# Patient Record
Sex: Female | Born: 1985 | Race: White | Hispanic: No | Smoking: Never smoker
Health system: Southern US, Community
[De-identification: ages and names within clinical notes are randomized; demographics above are authoritative.]

## PROBLEM LIST (undated history)

## (undated) DIAGNOSIS — R4789 Other speech disturbances: Secondary | ICD-10-CM

## (undated) DIAGNOSIS — F32A Depression, unspecified: Secondary | ICD-10-CM

## (undated) DIAGNOSIS — R413 Other amnesia: Secondary | ICD-10-CM

## (undated) DIAGNOSIS — F329 Major depressive disorder, single episode, unspecified: Secondary | ICD-10-CM

## (undated) HISTORY — DX: Other amnesia: R41.3

## (undated) HISTORY — DX: Other speech disturbances: R47.89

---
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
## 2005-05-28 ENCOUNTER — Encounter: Admission: RE | Admit: 2005-05-28 | Discharge: 2005-05-28 | Payer: Self-pay | Admitting: Family Medicine

## 2008-05-28 ENCOUNTER — Inpatient Hospital Stay (HOSPITAL_COMMUNITY): Admission: AD | Admit: 2008-05-28 | Discharge: 2008-05-30 | Payer: Self-pay | Admitting: Obstetrics and Gynecology

## 2008-06-14 ENCOUNTER — Encounter: Admission: RE | Admit: 2008-06-14 | Discharge: 2008-06-20 | Payer: Self-pay | Admitting: Obstetrics and Gynecology

## 2010-04-16 LAB — CBC
HCT: 30.2 % — ABNORMAL LOW (ref 36.0–46.0)
HCT: 39 % (ref 36.0–46.0)
Hemoglobin: 10.6 g/dL — ABNORMAL LOW (ref 12.0–15.0)
Hemoglobin: 13.7 g/dL (ref 12.0–15.0)
MCHC: 35.2 g/dL (ref 30.0–36.0)
MCV: 96.7 fL (ref 78.0–100.0)
Platelets: 175 10*3/uL (ref 150–400)
Platelets: 227 10*3/uL (ref 150–400)
RBC: 4.06 MIL/uL (ref 3.87–5.11)
RDW: 13.5 % (ref 11.5–15.5)
WBC: 10.9 10*3/uL — ABNORMAL HIGH (ref 4.0–10.5)

## 2010-04-16 LAB — RH IMMUNE GLOB WKUP(>/=20WKS)(NOT WOMEN'S HOSP): Fetal Screen: NEGATIVE

## 2014-01-06 NOTE — L&D Delivery Note (Signed)
Delivery Note  First Stage: Labor onset: 0300 Augmentation: AROM Analgesia /Anesthesia intrapartum: none AROM at 1124  Second Stage: Complete dilation at 1140 Onset of pushing at 1140 FHR second stage 110s, mod variability, no accels, variables w/pushing  In maternal lithotomy, delivery of a viable female at [id number] by CNM in OA position with restitution to LOT and Rt compound arm, cord around arm x1, loose Cord double clamped after cessation of pulsation, cut by CNM Cord blood sample collected   Collection of cord blood donation n/a Arterial cord blood sample n/a  Third Stage: Placenta delivered via Tomasa BlaseSchultz intact with 3 VC @ 1207 Placenta disposition: routine disposal Uterine tone firm / bleeding small  2nd degree perineal and Rt labial laceration identified  Anesthesia for repair: local Repaired with 2-0 & 3-0 Vicryl rapide Est. Blood Loss (mL): 300  Complications: none  Mom to postpartum.  Baby to Couplet care / Skin to Skin.  Newborn: Birth Weight: pending Apgar Scores: 9-9 Feeding planned: breast  Donette LarryBHAMBRI, Tahjanae Blankenburg, N MSN, CNM 11/12/2014, 12:30 PM

## 2014-04-13 LAB — OB RESULTS CONSOLE ABO/RH: RH TYPE: NEGATIVE

## 2014-04-13 LAB — OB RESULTS CONSOLE RUBELLA ANTIBODY, IGM: RUBELLA: IMMUNE

## 2014-04-13 LAB — OB RESULTS CONSOLE HEPATITIS B SURFACE ANTIGEN: Hepatitis B Surface Ag: NEGATIVE

## 2014-04-13 LAB — OB RESULTS CONSOLE HIV ANTIBODY (ROUTINE TESTING): HIV: NONREACTIVE

## 2014-04-13 LAB — OB RESULTS CONSOLE RPR: RPR: NONREACTIVE

## 2014-11-02 LAB — OB RESULTS CONSOLE GBS: GBS: NEGATIVE

## 2014-11-10 ENCOUNTER — Other Ambulatory Visit: Payer: Self-pay | Admitting: Obstetrics and Gynecology

## 2014-11-12 ENCOUNTER — Inpatient Hospital Stay (HOSPITAL_COMMUNITY)
Admission: AD | Admit: 2014-11-12 | Discharge: 2014-11-13 | DRG: 775 | Disposition: A | Discharge status: Home / Self Care | Payer: Managed Care, Other (non HMO) | Source: Ambulatory Visit | Attending: Obstetrics | Admitting: Obstetrics

## 2014-11-12 ENCOUNTER — Encounter (HOSPITAL_COMMUNITY): Payer: Self-pay | Admitting: *Deleted

## 2014-11-12 DIAGNOSIS — Z3A40 40 weeks gestation of pregnancy: Secondary | ICD-10-CM

## 2014-11-12 DIAGNOSIS — IMO0001 Reserved for inherently not codable concepts without codable children: Secondary | ICD-10-CM

## 2014-11-12 DIAGNOSIS — O480 Post-term pregnancy: Principal | ICD-10-CM | POA: Diagnosis present

## 2014-11-12 LAB — CBC
HEMATOCRIT: 35.6 % — AB (ref 36.0–46.0)
Hemoglobin: 12.4 g/dL (ref 12.0–15.0)
MCH: 31.6 pg (ref 26.0–34.0)
MCHC: 34.8 g/dL (ref 30.0–36.0)
MCV: 90.8 fL (ref 78.0–100.0)
Platelets: 214 10*3/uL (ref 150–400)
RBC: 3.92 MIL/uL (ref 3.87–5.11)
RDW: 13.5 % (ref 11.5–15.5)
WBC: 16.1 10*3/uL — AB (ref 4.0–10.5)

## 2014-11-12 MED ORDER — ONDANSETRON HCL 4 MG/2ML IJ SOLN: 4.0000 mg | Freq: Four times a day (QID) | INTRAMUSCULAR | Status: DC | PRN

## 2014-11-12 MED ORDER — OXYTOCIN BOLUS FROM INFUSION: 500.0000 mL | INTRAVENOUS | Status: DC

## 2014-11-12 MED ORDER — OXYCODONE-ACETAMINOPHEN 5-325 MG PO TABS: 1.0000 | ORAL_TABLET | ORAL | Status: DC | PRN

## 2014-11-12 MED ORDER — DM-GUAIFENESIN ER 30-600 MG PO TB12
1.0000 | ORAL_TABLET | Freq: Two times a day (BID) | ORAL | Status: DC
  Administered 2014-11-12 – 2014-11-13 (×3): 1 via ORAL
  Filled 2014-11-12 (×5): qty 1

## 2014-11-12 MED ORDER — PHENYLEPHRINE 40 MCG/ML (10ML) SYRINGE FOR IV PUSH (FOR BLOOD PRESSURE SUPPORT)
80.0000 ug | PREFILLED_SYRINGE | INTRAVENOUS | Status: DC | PRN
  Filled 2014-11-12: qty 2

## 2014-11-12 MED ORDER — IBUPROFEN 600 MG PO TABS
600.0000 mg | ORAL_TABLET | Freq: Four times a day (QID) | ORAL | Status: DC
  Administered 2014-11-12 – 2014-11-13 (×4): 600 mg via ORAL
  Filled 2014-11-12 (×4): qty 1

## 2014-11-12 MED ORDER — FENTANYL 2.5 MCG/ML BUPIVACAINE 1/10 % EPIDURAL INFUSION (WH - ANES): 14.0000 mL/h | INTRAMUSCULAR | Status: DC | PRN

## 2014-11-12 MED ORDER — EPHEDRINE 5 MG/ML INJ
10.0000 mg | INTRAVENOUS | Status: DC | PRN
  Filled 2014-11-12: qty 2

## 2014-11-12 MED ORDER — CITRIC ACID-SODIUM CITRATE 334-500 MG/5ML PO SOLN: 30.0000 mL | ORAL | Status: DC | PRN

## 2014-11-12 MED ORDER — OXYTOCIN 40 UNITS IN LACTATED RINGERS INFUSION - SIMPLE MED: 62.5000 mL/h | INTRAVENOUS | Status: DC

## 2014-11-12 MED ORDER — DIPHENHYDRAMINE HCL 50 MG/ML IJ SOLN: 12.5000 mg | INTRAMUSCULAR | Status: DC | PRN

## 2014-11-12 MED ORDER — ACETAMINOPHEN 325 MG PO TABS: 650.0000 mg | ORAL_TABLET | ORAL | Status: DC | PRN

## 2014-11-12 MED ORDER — PRENATAL MULTIVITAMIN CH
1.0000 | ORAL_TABLET | Freq: Every day | ORAL | Status: DC
  Administered 2014-11-13: 1 via ORAL
  Filled 2014-11-12: qty 1

## 2014-11-12 MED ORDER — LACTATED RINGERS IV SOLN: INTRAVENOUS | Status: DC

## 2014-11-12 MED ORDER — OXYTOCIN 10 UNIT/ML IJ SOLN
INTRAMUSCULAR | Status: AC
  Filled 2014-11-12: qty 1

## 2014-11-12 MED ORDER — LACTATED RINGERS IV SOLN
500.0000 mL | INTRAVENOUS | Status: DC | PRN
Start: 2014-11-12 — End: 2014-11-12

## 2014-11-12 MED ORDER — FLEET ENEMA 7-19 GM/118ML RE ENEM: 1.0000 | ENEMA | RECTAL | Status: DC | PRN

## 2014-11-12 MED ORDER — OXYCODONE-ACETAMINOPHEN 5-325 MG PO TABS: 2.0000 | ORAL_TABLET | ORAL | Status: DC | PRN

## 2014-11-12 MED ORDER — LIDOCAINE HCL (PF) 1 % IJ SOLN
30.0000 mL | INTRAMUSCULAR | Status: DC | PRN
  Administered 2014-11-12: 30 mL via SUBCUTANEOUS
  Filled 2014-11-12: qty 30

## 2014-11-12 MED ORDER — LANOLIN HYDROUS EX OINT: TOPICAL_OINTMENT | CUTANEOUS | Status: DC | PRN

## 2014-11-12 MED ORDER — OXYTOCIN 10 UNIT/ML IJ SOLN
10.0000 [IU] | Freq: Once | INTRAMUSCULAR | Status: DC | PRN
  Filled 2014-11-12: qty 1

## 2014-11-12 MED ORDER — BENZOCAINE-MENTHOL 20-0.5 % EX AERO
1.0000 "application " | INHALATION_SPRAY | CUTANEOUS | Status: DC | PRN
  Administered 2014-11-12: 1 via TOPICAL
  Filled 2014-11-12: qty 56

## 2014-11-12 MED ORDER — SENNOSIDES-DOCUSATE SODIUM 8.6-50 MG PO TABS
2.0000 | ORAL_TABLET | ORAL | Status: DC
  Administered 2014-11-13: 2 via ORAL
  Filled 2014-11-12: qty 2

## 2014-11-12 NOTE — MAU Note (Signed)
C/o ucs since 0330 this am;

## 2014-11-12 NOTE — Progress Notes (Signed)
Subjective:   Uncomfortable with ctx, breathing with each.   Objective:   VS: Blood pressure 106/68, pulse 87, temperature 98.6 F (37 C), temperature source Axillary, resp. rate 18, height 5\' 7"  (1.702 m), weight 67.359 kg (148 lb 8 oz). FHR: baseline 150 bpm / variability mod / accelerations + / no decelerations Toco: contractions every 2-3 minutes Cervix: Dilation: 7.5 Effacement (%): 80 Station: +1 Exam by:: Fabian NovemberM. Kristien Salatino, CNM Membranes: AROM-clear, abundant  Assessment:  Labor: active FHR category I  Plan:  Labor support w/o medications, position changes, anticipate SVD.     Donette LarryBHAMBRI, Alger Kerstein, N MSN, CNM 11/12/2014, 11:27 AM

## 2014-11-12 NOTE — H&P (Signed)
OB ADMISSION/ HISTORY & PHYSICAL:  Admission Date: 11/12/2014  8:47 AM  Admit Diagnosis: 40.[redacted] weeks gestation  Lauren Duke is a 29 y.o. female presenting for active labor.  Prenatal History: G2P1001   EDC:11/07/2014, Alternate EDD Entry   [facility]  Primary Ob Provider: Dr. Billy Coastaavon Prenatal course complicated by Rh negative.  Prenatal Labs: ABO, Rh: B (04/07 0000) Neg Antibody:  Neg Rubella: Immune (04/07 0000)  RPR: Nonreactive (04/07 0000)  HBsAg: Negative (04/07 0000)  HIV: Non-reactive (04/07 0000)  GBS: Negative (10/27 0000)  1 hr GTT: 124 Genetic screen: nml  Medical / Surgical History :  Past medical history: none  Past surgical history: Jaw reconstruction-2013  Family History: No family history on file.   Social History: negative  Allergies: Review of patient's allergies indicates no known allergies.   Current Medications at time of admission:  Prior to Admission medications   Medication Sig Start Date End Date Taking? Authorizing Provider  calcium carbonate (TUMS - DOSED IN MG ELEMENTAL CALCIUM) 500 MG chewable tablet Chew 1 tablet by mouth daily.   Yes Historical Provider, MD  Prenatal Vit-Fe Fumarate-FA (PRENATAL MULTIVITAMIN) TABS tablet Take 1 tablet by mouth daily at 12 noon.   Yes Historical Provider, MD    Review of Systems: +ctx since 0300 +bloody show +FM No LOF No back pain  Physical Exam:  VS: Blood pressure 106/68, pulse 87, temperature 98.6 F (37 C), temperature source Axillary, resp. rate 18.  General: alert and oriented, appears very uncomfortable with ctx Heart: RRR Lungs: Clear lung fields Abdomen: Gravid, soft and non-tender, non-distended / uterus: gravid, non-tender Extremities: No edema Genitalia / VE: Dilation: 7 Effacement (%): 80 Station: +1 Exam by:: Niko Penson CNM  FHR: baseline rate 145 / variability mod / accelerations + / no decelerations TOCO: 2-3  Assessment: 40.[redacted] weeks  gestation Labor: active FHR category I GBS: neg  Plan:  Admit, intermittent EFM after reactive tracing, declines analgesia, anticipate SVD. Dr. Ernestina PennaFogleman notified of admission / plan of care   Lauren Duke, Lauren Duke, N MSN, CNM 11/12/2014, 10:07 AM

## 2014-11-13 LAB — RPR: RPR: NONREACTIVE

## 2014-11-13 MED ORDER — OXYCODONE-ACETAMINOPHEN 5-325 MG PO TABS: 1.0000 | ORAL_TABLET | ORAL | Status: DC | PRN

## 2014-11-13 MED ORDER — IBUPROFEN 600 MG PO TABS: 600.0000 mg | ORAL_TABLET | Freq: Four times a day (QID) | ORAL | Status: DC

## 2014-11-13 MED ORDER — RHO D IMMUNE GLOBULIN 1500 UNIT/2ML IJ SOSY
300.0000 ug | PREFILLED_SYRINGE | Freq: Once | INTRAMUSCULAR | Status: AC
  Administered 2014-11-13: 300 ug via INTRAMUSCULAR
  Filled 2014-11-13: qty 2

## 2014-11-13 NOTE — Progress Notes (Signed)
TC from nurse - Peds ok for early DC  ready for DC home this afternoon   Marlinda Mikeanya Brian Zeitlin CNM [facility]

## 2014-11-13 NOTE — Progress Notes (Signed)
Pt was walking with baby in arms down hall, while breastfeeding. RN walked pt back to room where she stated she wanted to go home, and was about ready to leave AMA. RN followed up with the midwife and physician on call; who were in an emergency surgery. The pt believed that the midwife left the building and was unable to be reached. Pt was very angry but stated she understood the delay. Pt d/c'd immediately after the surgery was complete.

## 2014-11-13 NOTE — Progress Notes (Signed)
PPD 1 SVD  S:  Reports feeling well - interested in DC this afternoon             Tolerating po/ No nausea or vomiting             Bleeding is light             Pain controlled withmotrin             Up ad lib / ambulatory / voiding QS  Newborn breast feeding  / Circumcision not planned O:               VS: BP 109/72 mmHg  Pulse 86  Temp(Src) 98.1 F (36.7 C) (Oral)  Resp 18  Ht 5\' 7"  (1.702 m)  Wt 67.359 kg (148 lb 8 oz)  BMI 23.25 kg/m2  Breastfeeding? Unknown   LABS:              Recent Labs  11/12/14 1230  WBC 16.1*  HGB 12.4  PLT 214               Blood type: --/--/B NEG (11/06 1230)  Rubella: Immune (04/07 0000)                     I&O: Intake/Output      11/06 0701 - 11/07 0700 11/07 0701 - 11/08 0700   Urine (mL/kg/hr) 0    Blood 300    Total Output 300     Net -300          Urine Occurrence 1 x                  Physical Exam:             Alert and oriented X3  Abdomen: soft, non-tender, non-distended              Fundus: firm, non-tender, U-1  Perineum: mild edema  Lochia: light  Extremities: no edema, no calf pain or tenderness    A: PPD # 1   Doing well - stable status  P: Routine post partum orders  Possible early DC this afternoon based on Peds decision for newborn DC  [name], [name] CNM, MSN, [facility] 11/13/2014, 7:55 AM

## 2014-11-13 NOTE — Discharge Summary (Signed)
Obstetric Discharge Summary  Reason for Admission: onset of labor Prenatal Procedures: none Intrapartum Procedures: spontaneous vaginal delivery Postpartum Procedures: none Complications-Operative and Postpartum: 2nd degree perineal laceration HEMOGLOBIN  Date Value Ref Range Status  11/12/2014 12.4 12.0 - 15.0 g/dL Final   HCT  Date Value Ref Range Status  11/12/2014 35.6* 36.0 - 46.0 % Final    Physical Exam:  General: alert, cooperative and no distress Lochia: appropriate Uterine Fundus: firm Incision: healing well DVT Evaluation: No evidence of DVT seen on physical exam.  Discharge Diagnoses: Term Pregnancy-delivered  Discharge Information: Date: 11/13/2014 Activity: pelvic rest Diet: routine Medications: PNV, Ibuprofen and Percocet Condition: stable Instructions: refer to practice specific booklet Discharge to: home Follow-up Information    Follow up with Lenoard AdenAAVON,RICHARD J, MD. Schedule an appointment as soon as possible for a visit in 6 weeks.   Specialty:  Obstetrics and Gynecology   Contact information:   [address] [place] [address] [phone]       Newborn Data: Live born female  Birth Weight: 7 lb 0.6 oz (3192 g) APGAR: 9, 9  Home with mother.  [name], Jaidan Stachnik 11/13/2014, 3:55 PM

## 2014-11-14 LAB — RH IG WORKUP (INCLUDES ABO/RH)
ABO/RH(D): B NEG
Fetal Screen: NEGATIVE
GESTATIONAL AGE(WKS): 40
Unit division: 0

## 2014-11-16 LAB — TYPE AND SCREEN
ABO/RH(D): B NEG
Antibody Screen: POSITIVE
DAT, IgG: NEGATIVE
UNIT DIVISION: 0
UNIT DIVISION: 0

## 2014-11-21 ENCOUNTER — Inpatient Hospital Stay (HOSPITAL_COMMUNITY): Admission: RE | Admit: 2014-11-21 | Payer: Managed Care, Other (non HMO) | Source: Ambulatory Visit

## 2017-03-17 ENCOUNTER — Encounter: Payer: Self-pay | Admitting: Sports Medicine

## 2017-03-17 ENCOUNTER — Ambulatory Visit (INDEPENDENT_AMBULATORY_CARE_PROVIDER_SITE_OTHER): Payer: Commercial Managed Care - PPO | Admitting: Sports Medicine

## 2017-03-17 ENCOUNTER — Ambulatory Visit (INDEPENDENT_AMBULATORY_CARE_PROVIDER_SITE_OTHER): Payer: Commercial Managed Care - PPO

## 2017-03-17 DIAGNOSIS — S86892A Other injury of other muscle(s) and tendon(s) at lower leg level, left leg, initial encounter: Secondary | ICD-10-CM

## 2017-03-17 DIAGNOSIS — M79662 Pain in left lower leg: Secondary | ICD-10-CM

## 2017-03-17 DIAGNOSIS — S86899A Other injury of other muscle(s) and tendon(s) at lower leg level, unspecified leg, initial encounter: Secondary | ICD-10-CM | POA: Insufficient documentation

## 2017-03-17 NOTE — Patient Instructions (Signed)
Hip Rehabilitation Protocol:  1.  Side leg raises.  3x30 with no weight, then 3x15 with 2 lb ankle weight, then 3x15 with 5 lb ankle weight 2.  Standing hip rotation.  3x30 with no weight, then 3x15 with 2 lb ankle weight, then 3x15 with 5 lb ankle weight. 3.  Side step ups.  3x30 with no weight, then 3x15 with 5 lbs in backpack, then 3x15 with 10 lbs in backpack. 

## 2017-03-17 NOTE — Assessment & Plan Note (Signed)
Likely stress syndrome versus stress fracture. Weak hip abductor's on the left. Aircast, hip abductor rehab exercises, tibial x-rays. Return for custom molded orthotics.

## 2017-03-17 NOTE — Progress Notes (Signed)
Subjective:    I'm seeing this patient as a consultation for: Dr. Devra Doppamieka Howell  CC: Left leg pain  HPI: This is a pleasant 32 year old female, she has been increasing her running training for a half marathon, she is currently doing a proximally 6-10 miles per week.  At a recent increase in mileage she started to notice pain on the posterior medial aspect of her left leg, without radiation.  Worse when running, however she is able to run through the pain, and stops fairly immediately after running.  I reviewed the past medical history, family history, social history, surgical history, and allergies today and no changes were needed.  Please see the problem list section below in epic for further details.  Past Medical History: History reviewed. No pertinent past medical history. Past Surgical History: History reviewed. No pertinent surgical history. Social History: Social History   Socioeconomic History  . Marital status: Married    Spouse name: None  . Number of children: None  . Years of education: None  . Highest education level: None  Social Needs  . Financial resource strain: None  . Food insecurity - worry: None  . Food insecurity - inability: None  . Transportation needs - medical: None  . Transportation needs - non-medical: None  Occupational History  . None  Tobacco Use  . Smoking status: Never Smoker  Substance and Sexual Activity  . Alcohol use: None  . Drug use: None  . Sexual activity: None  Other Topics Concern  . None  Social History Narrative  . None   Family History: No family history on file. Allergies: No Known Allergies Medications: See med rec.  Review of Systems: No headache, visual changes, nausea, vomiting, diarrhea, constipation, dizziness, abdominal pain, skin rash, fevers, chills, night sweats, weight loss, swollen lymph nodes, body aches, joint swelling, muscle aches, chest pain, shortness of breath, mood changes, visual or auditory  hallucinations.   Objective:   General: Well Developed, well nourished, and in no acute distress.  Neuro:  Extra-ocular muscles intact, able to move all 4 extremities, sensation grossly intact.  Deep tendon reflexes tested were normal. Psych: Alert and oriented, mood congruent with affect. ENT:  Ears and nose appear unremarkable.  Hearing grossly normal. Neck: Unremarkable overall appearance, trachea midline.  No visible thyroid enlargement. Eyes: Conjunctivae and lids appear unremarkable.  Pupils equal and round. Skin: Warm and dry, no rashes noted.  Cardiovascular: Pulses palpable, no extremity edema. Left ankle: No visible erythema or swelling. Range of motion is full in all directions. Strength is 5/5 in all directions. Stable lateral and medial ligaments; squeeze test and kleiger test unremarkable; Talar dome nontender; No pain at base of 5th MT; No tenderness over cuboid; No tenderness over N spot or navicular prominence No tenderness on posterior aspects of lateral and medial malleolus No sign of peroneal tendon subluxations; Negative tarsal tunnel tinel's Tender to palpation over the posterior medial aspect of the tibial shaft consistent with a stress injury, no palpable bony callus. Hip abductors are weak on the left side compared to the right.  Impression and Recommendations:   This case required medical decision making of moderate complexity.  Left medial tibial stress syndrome vs stress fracture Likely stress syndrome versus stress fracture. Weak hip abductor's on the left. Aircast, hip abductor rehab exercises, tibial x-rays. Return for custom molded orthotics. ___________________________________________ Ihor Austinhomas J. Benjamin Stainhekkekandam, M.D., ABFM., CAQSM. Primary Care and Sports Medicine [facility]  Adjunct Instructor of Family Medicine  [employer]

## 2017-03-23 ENCOUNTER — Encounter: Payer: Self-pay | Admitting: Sports Medicine

## 2017-03-27 ENCOUNTER — Ambulatory Visit: Payer: Commercial Managed Care - PPO | Admitting: Sports Medicine

## 2017-03-27 DIAGNOSIS — S86892D Other injury of other muscle(s) and tendon(s) at lower leg level, left leg, subsequent encounter: Secondary | ICD-10-CM

## 2017-03-27 NOTE — Progress Notes (Signed)
    Patient was fitted for a : standard, cushioned, semi-rigid orthotic. The orthotic was heated and afterward the patient stood on the orthotic blank positioned on the orthotic stand. The patient was positioned in subtalar neutral position and 10 degrees of ankle dorsiflexion in a weight bearing stance. After completion of molding, a stable base was applied to the orthotic blank. The blank was ground to a stable position for weight bearing. Size: 7 Base: White EVA Additional Posting and Padding: None The patient ambulated these, and they were very comfortable.  I spent 40 minutes with this patient, greater than 50% was face-to-face time counseling regarding the below diagnosis.  ___________________________________________ Oney Tatlock J. Riham Polyakov, M.D., ABFM., CAQSM. Primary Care and Sports Medicine [facility]  Adjunct Instructor of Family Medicine  [employer]   

## 2017-03-27 NOTE — Assessment & Plan Note (Signed)
Left medial tibial stress syndrome versus stress fracture. Weak hip abductor's on the left but doing extremely well with hip abductor rehab exercises, continue Aircast for runs. Custom orthotics today, return in 1 month. We will recheck her hip abductor strength at that time as well.

## 2017-04-20 ENCOUNTER — Encounter: Payer: Self-pay | Admitting: Sports Medicine

## 2017-04-20 ENCOUNTER — Ambulatory Visit (INDEPENDENT_AMBULATORY_CARE_PROVIDER_SITE_OTHER): Payer: Commercial Managed Care - PPO

## 2017-04-20 ENCOUNTER — Ambulatory Visit: Payer: Commercial Managed Care - PPO | Admitting: Sports Medicine

## 2017-04-20 DIAGNOSIS — M25551 Pain in right hip: Secondary | ICD-10-CM | POA: Diagnosis not present

## 2017-04-20 DIAGNOSIS — S86892D Other injury of other muscle(s) and tendon(s) at lower leg level, left leg, subsequent encounter: Secondary | ICD-10-CM | POA: Diagnosis not present

## 2017-04-20 MED ORDER — CALCIUM CARBONATE-VITAMIN D 600-400 MG-UNIT PO TABS: 1.0000 | ORAL_TABLET | Freq: Two times a day (BID) | ORAL | 11 refills | Status: DC

## 2017-04-20 MED ORDER — CELECOXIB 200 MG PO CAPS: ORAL_CAPSULE | ORAL | 2 refills | Status: DC

## 2017-04-20 NOTE — Assessment & Plan Note (Signed)
Clinically resolved with custom orthotics, hip abductor rehabilitation exercises. No limitations with regards to this.

## 2017-04-20 NOTE — Progress Notes (Signed)
Subjective:    I'm seeing this patient as a consultation for: Dr. Devra Dopp  CC: Right groin pain  HPI: This is a very pleasant 32 year old female runner, I treated her recently for a medial tibial stress syndrome that resolved with custom orthotics and hip abductor rehabilitation.  She was in the middle of a 13 mile run, at my late she developed severe pain deep into the buttock and groin on the right side, she limped through the remaining run, its improved slightly over the past week but her pain is however persistent.  Localized without radiation, worse with weightbearing, not worse with sitting, flexion, Valsalva, no bowel or bladder dysfunction, saddle numbness, constitutional symptoms.  I reviewed the past medical history, family history, social history, surgical history, and allergies today and no changes were needed.  Please see the problem list section below in epic for further details.  Past Medical History: No past medical history on file. Past Surgical History: No past surgical history on file. Social History: Social History   Socioeconomic History  . Marital status: Married    Spouse name: Not on file  . Number of children: Not on file  . Years of education: Not on file  . Highest education level: Not on file  Occupational History  . Not on file  Social Needs  . Financial resource strain: Not on file  . Food insecurity:    Worry: Not on file    Inability: Not on file  . Transportation needs:    Medical: Not on file    Non-medical: Not on file  Tobacco Use  . Smoking status: Never Smoker  . Smokeless tobacco: Never Used  Substance and Sexual Activity  . Alcohol use: Not on file  . Drug use: Not on file  . Sexual activity: Not on file  Lifestyle  . Physical activity:    Days per week: Not on file    Minutes per session: Not on file  . Stress: Not on file  Relationships  . Social connections:    Talks on phone: Not on file    Gets together: Not on  file    Attends religious service: Not on file    Active member of club or organization: Not on file    Attends meetings of clubs or organizations: Not on file    Relationship status: Not on file  Other Topics Concern  . Not on file  Social History Narrative  . Not on file   Family History: No family history on file. Allergies: No Known Allergies Medications: See med rec.  Review of Systems: No headache, visual changes, nausea, vomiting, diarrhea, constipation, dizziness, abdominal pain, skin rash, fevers, chills, night sweats, weight loss, swollen lymph nodes, body aches, joint swelling, muscle aches, chest pain, shortness of breath, mood changes, visual or auditory hallucinations.   Objective:   General: Well Developed, well nourished, and in no acute distress.  Neuro:  Extra-ocular muscles intact, able to move all 4 extremities, sensation grossly intact.  Deep tendon reflexes tested were normal. Psych: Alert and oriented, mood congruent with affect. ENT:  Ears and nose appear unremarkable.  Hearing grossly normal. Neck: Unremarkable overall appearance, trachea midline.  No visible thyroid enlargement. Eyes: Conjunctivae and lids appear unremarkable.  Pupils equal and round. Skin: Warm and dry, no rashes noted.  Cardiovascular: Pulses palpable, no extremity edema. Right hip: ROM IR: 60 Deg, ER: 60 Deg, Flexion: 120 Deg, Extension: 100 Deg, Abduction: 45 Deg, Adduction: 45 Deg Strength  IR: 5/5, ER: 5/5, Flexion: 5/5, Extension: 5/5, Abduction: 5/5, Adduction: 5/5 Pelvic alignment unremarkable to inspection and palpation. Standing hip rotation and gait without trendelenburg / unsteadiness. Greater trochanter without tenderness to palpation. No tenderness over piriformis. No SI joint tenderness and normal minimal SI movement.  Hip and pelvic x-rays reviewed personally, unremarkable in appearance, IUD in place.  Impression and Recommendations:   This case required medical  decision making of moderate complexity.  Pelvic joint pain, right I think her pain is coming from the inferior pubic ramus on the right, no evidence on exam of her femoral neck stress fracture. Her flexors, extensors, abductor's and adductor's are all firing without pain. Nonweightbearing with crutches. X-rays, switching to Celebrex, she did get some gastritis with ibuprofen. Adding a calcium and vitamin D supplement. We did discuss that once this was healed she will work on aggressive hip and pelvic girdle muscle strengthening rather than just running and stretching. Return to see me in 2 weeks, MRI if no better.  Left medial tibial stress syndrome vs stress fracture Clinically resolved with custom orthotics, hip abductor rehabilitation exercises. No limitations with regards to this. ___________________________________________ Ihor Austinhomas J. Benjamin Stainhekkekandam, M.D., ABFM., CAQSM. Primary Care and Sports Medicine [facility]  Adjunct Instructor of Family Medicine  [employer]

## 2017-04-20 NOTE — Assessment & Plan Note (Signed)
I think her pain is coming from the inferior pubic ramus on the right, no evidence on exam of her femoral neck stress fracture. Her flexors, extensors, abductor's and adductor's are all firing without pain. Nonweightbearing with crutches. X-rays, switching to Celebrex, she did get some gastritis with ibuprofen. Adding a calcium and vitamin D supplement. We did discuss that once this was healed she will work on aggressive hip and pelvic girdle muscle strengthening rather than just running and stretching. Return to see me in 2 weeks, MRI if no better.

## 2017-04-27 ENCOUNTER — Ambulatory Visit: Payer: Commercial Managed Care - PPO | Admitting: Sports Medicine

## 2017-05-04 ENCOUNTER — Ambulatory Visit: Payer: Commercial Managed Care - PPO | Admitting: Sports Medicine

## 2017-05-04 DIAGNOSIS — Z0189 Encounter for other specified special examinations: Secondary | ICD-10-CM

## 2017-05-05 ENCOUNTER — Ambulatory Visit: Payer: Commercial Managed Care - PPO | Admitting: Sports Medicine

## 2017-05-11 ENCOUNTER — Encounter: Payer: Self-pay | Admitting: Sports Medicine

## 2017-05-11 ENCOUNTER — Ambulatory Visit (INDEPENDENT_AMBULATORY_CARE_PROVIDER_SITE_OTHER): Payer: Commercial Managed Care - PPO | Admitting: Sports Medicine

## 2017-05-11 VITALS — BP 107/68 | HR 74 | Resp 16 | Wt 122.0 lb

## 2017-05-11 DIAGNOSIS — M238X9 Other internal derangements of unspecified knee: Secondary | ICD-10-CM

## 2017-05-11 DIAGNOSIS — M25551 Pain in right hip: Secondary | ICD-10-CM

## 2017-05-11 NOTE — Assessment & Plan Note (Signed)
Sprays were negative, she did some rehab exercises, using Celebrex, calcium and vitamin D supplement, all of her pain is now gone. Happy with results so far.   She is advancing to using ankle weights for her hip abductor rehab exercises.

## 2017-05-11 NOTE — Assessment & Plan Note (Signed)
Bilateral but with no pain. Advised that this was essentially a normal phenomenon and to avoid exercises with deep knee flexion past 90 degrees, return as needed for this.

## 2017-05-11 NOTE — Progress Notes (Signed)
Subjective:    I'm seeing this patient as a consultation for: Dr. Devra Dopp  CC: Knees grinding  HPI: For the past several weeks this pleasant 32 year old female has noted an odd grinding sensation when doing squats.  She localizes this under both kneecaps without pain.  Pelvic pain: Resolved  I reviewed the past medical history, family history, social history, surgical history, and allergies today and no changes were needed.  Please see the problem list section below in epic for further details.  Past Medical History: No past medical history on file. Past Surgical History: No past surgical history on file. Social History: Social History   Socioeconomic History  . Marital status: Married    Spouse name: Not on file  . Number of children: Not on file  . Years of education: Not on file  . Highest education level: Not on file  Occupational History  . Not on file  Social Needs  . Financial resource strain: Not on file  . Food insecurity:    Worry: Not on file    Inability: Not on file  . Transportation needs:    Medical: Not on file    Non-medical: Not on file  Tobacco Use  . Smoking status: Never Smoker  . Smokeless tobacco: Never Used  Substance and Sexual Activity  . Alcohol use: Not on file  . Drug use: Not on file  . Sexual activity: Not on file  Lifestyle  . Physical activity:    Days per week: Not on file    Minutes per session: Not on file  . Stress: Not on file  Relationships  . Social connections:    Talks on phone: Not on file    Gets together: Not on file    Attends religious service: Not on file    Active member of club or organization: Not on file    Attends meetings of clubs or organizations: Not on file    Relationship status: Not on file  Other Topics Concern  . Not on file  Social History Narrative  . Not on file   Family History: No family history on file. Allergies: No Known Allergies Medications: See med rec.  Review of  Systems: No headache, visual changes, nausea, vomiting, diarrhea, constipation, dizziness, abdominal pain, skin rash, fevers, chills, night sweats, weight loss, swollen lymph nodes, body aches, joint swelling, muscle aches, chest pain, shortness of breath, mood changes, visual or auditory hallucinations.   Objective:   General: Well Developed, well nourished, and in no acute distress.  Neuro:  Extra-ocular muscles intact, able to move all 4 extremities, sensation grossly intact.  Deep tendon reflexes tested were normal. Psych: Alert and oriented, mood congruent with affect. ENT:  Ears and nose appear unremarkable.  Hearing grossly normal. Neck: Unremarkable overall appearance, trachea midline.  No visible thyroid enlargement. Eyes: Conjunctivae and lids appear unremarkable.  Pupils equal and round. Skin: Warm and dry, no rashes noted.  Cardiovascular: Pulses palpable, no extremity edema. Bilateral knees: Normal to inspection with no erythema or effusion or obvious bony abnormalities. Palpation normal with no warmth or joint line tenderness or patellar tenderness or condyle tenderness. ROM normal in flexion and extension and lower leg rotation. Ligaments with solid consistent endpoints including ACL, PCL, LCL, MCL. Negative Mcmurray's and provocative meniscal tests. Non painful patellar compression. Mild, nonpainful patellar crepitus bilaterally. Patellar and quadriceps tendons unremarkable. Hamstring and quadriceps strength is normal.  Impression and Recommendations:   This case required medical decision making of  moderate complexity.  Pelvic joint pain, right Sprays were negative, she did some rehab exercises, using Celebrex, calcium and vitamin D supplement, all of her pain is now gone. Happy with results so far.   She is advancing to using ankle weights for her hip abductor rehab exercises.  Patellofemoral crepitus Bilateral but with no pain. Advised that this was essentially a  normal phenomenon and to avoid exercises with deep knee flexion past 90 degrees, return as needed for this. ___________________________________________ Ihor Austin. Benjamin Stain, M.D., ABFM., CAQSM. Primary Care and Sports Medicine [facility]  Adjunct Instructor of Family Medicine  [employer]

## 2018-03-24 ENCOUNTER — Other Ambulatory Visit: Payer: Self-pay

## 2018-03-24 ENCOUNTER — Ambulatory Visit: Payer: Commercial Managed Care - PPO | Admitting: Psychology

## 2018-03-24 DIAGNOSIS — F4322 Adjustment disorder with anxiety: Secondary | ICD-10-CM

## 2018-04-09 ENCOUNTER — Ambulatory Visit (INDEPENDENT_AMBULATORY_CARE_PROVIDER_SITE_OTHER): Payer: Commercial Managed Care - PPO | Admitting: Psychology

## 2018-04-09 DIAGNOSIS — F4322 Adjustment disorder with anxiety: Secondary | ICD-10-CM

## 2018-04-20 ENCOUNTER — Encounter: Payer: Self-pay | Admitting: *Deleted

## 2018-04-21 ENCOUNTER — Encounter: Payer: Self-pay | Admitting: Neurology

## 2018-04-21 ENCOUNTER — Ambulatory Visit (INDEPENDENT_AMBULATORY_CARE_PROVIDER_SITE_OTHER): Payer: Commercial Managed Care - PPO | Admitting: Neurology

## 2018-04-21 ENCOUNTER — Other Ambulatory Visit: Payer: Self-pay

## 2018-04-21 DIAGNOSIS — F419 Anxiety disorder, unspecified: Secondary | ICD-10-CM | POA: Diagnosis not present

## 2018-04-21 DIAGNOSIS — R413 Other amnesia: Secondary | ICD-10-CM | POA: Diagnosis not present

## 2018-04-21 DIAGNOSIS — G43709 Chronic migraine without aura, not intractable, without status migrainosus: Secondary | ICD-10-CM | POA: Insufficient documentation

## 2018-04-21 DIAGNOSIS — IMO0002 Reserved for concepts with insufficient information to code with codable children: Secondary | ICD-10-CM

## 2018-04-21 MED ORDER — VENLAFAXINE HCL ER 37.5 MG PO CP24: 37.5000 mg | ORAL_CAPSULE | Freq: Every day | ORAL | 6 refills | Status: DC

## 2018-04-21 NOTE — Progress Notes (Signed)
   PATIENT: Lauren Duke DOB: 1985-04-15  Virtual Visit via [facility]  I connected with Lauren Duke on 04/21/18 at  by video and verified that I am speaking with the correct person using two identifiers.   I discussed the limitations, risks, security and privacy concerns of performing an evaluation and management service by video and the availability of in person appointments. I also discussed with the patient that there may be a patient responsible charge related to this service. The patient expressed understanding and agreed to proceed.  HISTORICAL  Lauren Duke is a 33 years old female, seen in request by her OB/GYN Dr. 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, graduate with masters degree, since January 2020, she began to noticed gradual onset memory loss, has difficulty handling her job, she has difficulty taking on new concept, word finding difficulties, gradually getting worse over the past couple months, now she has trouble recalling recent event, given some of the long-term memory has slipped away, she also complains of blurry vision, was seen by ophthalmologist, was reported normal, she denies dysarthria, no lateralized motor or sensory deficit  She does complains of anxiety, for a while she has difficulty sleeping, not improved, has good appetite, is on multiple supplements,  She had a CAT scan of brain with without contrast on March 02, 2018 that was normal  Laboratory evaluations in 2019/2020 reviewed, normal TSH, CBC, CMP, lipid profile, LDL 82, protein electrophoresis.  Observations/Objective: I have reviewed problem lists, medications, allergies.  Alert oriented to history taking and care of conversation, I also performed a Mini-Mental status examination  MMSE - Mini Mental State Exam 04/21/2018  Orientation to time 5  Orientation to Place 5  Registration 3  Attention/ Calculation 5  Recall 3  Language- name  2 objects 2  Language- repeat 1  Language- follow 3 step command 3  Language- read & follow direction 1  Write a sentence Not performed  Copy design Not performed  Total score 28   MMSE 28/28  Assessment and Plan: Memory loss  Normal CT scan of the brain with without contrast, laboratory evaluations, no treatable etiology identified,  Most likely related to her mood disorder, anxiety, Headache has migraine features  Patient still desires further evaluation, I have ordered MRI of the brain  I also suggested her continue work with her primary care physician for better control of her anxiety  Follow Up Instructions:  As needed    I discussed the assessment and treatment plan with the patient. The patient was provided an opportunity to ask questions and all were answered. The patient agreed with the plan and demonstrated an understanding of the instructions.   The patient was advised to call back or seek an in-person evaluation if the symptoms worsen or if the condition fails to improve as anticipated.  I provided 40 minutes of non-face-to-face time during this encounter.  Levert Feinstein, M.D. Ph.D.  [facility] [address] [place], [place] [address] Ph: [phone] Fax: [phone]  CC: [name], MD

## 2018-04-26 ENCOUNTER — Telehealth: Payer: Self-pay | Admitting: Neurology

## 2018-04-26 NOTE — Telephone Encounter (Signed)
MRI Brain wo approved for GI authorization [id number] (05/26/18).   Sent to GI via email. DW

## 2018-04-27 NOTE — Telephone Encounter (Signed)
Noted, thank you

## 2018-04-29 ENCOUNTER — Encounter

## 2018-04-29 ENCOUNTER — Ambulatory Visit: Payer: Commercial Managed Care - PPO | Admitting: Neurology

## 2018-05-04 ENCOUNTER — Telehealth: Payer: Self-pay | Admitting: *Deleted

## 2018-05-04 ENCOUNTER — Ambulatory Visit
Admission: RE | Admit: 2018-05-04 | Discharge: 2018-05-04 | Disposition: A | Discharge status: Home / Self Care | Payer: Commercial Managed Care - PPO | Source: Ambulatory Visit | Attending: Neurology | Admitting: Neurology

## 2018-05-04 ENCOUNTER — Other Ambulatory Visit: Payer: Self-pay

## 2018-05-04 DIAGNOSIS — R413 Other amnesia: Secondary | ICD-10-CM

## 2018-05-04 NOTE — Telephone Encounter (Signed)
I was able to call patient explain her MRI brain report  IMPRESSION: This MRI of the brain without contrast shows the following: 1.   Two punctate T2/flair hyperintense foci in the subcortical white matter of the frontal lobes.  This is a nonspecific finding that is unlikely to be symptomatic or clinically significant.  It could be incidental or represent minimal chronic microvascular ischemic change or the sequela of migraine headache.  Vasculitis or demyelination would be less likely. 2.  There are no acute findings.  Please help her sign up for my chart to have full access of her MRI report

## 2018-05-04 NOTE — Telephone Encounter (Signed)
Spoke to patient and she is aware of her results.  She would like a phone call back from Dr. Yan since the results were not completely normal. 

## 2018-05-04 NOTE — Telephone Encounter (Signed)
-----   Message from Levert Feinstein, MD sent at 05/04/2018  1:09 PM EDT ----- Please call patient, there is no significant abnormalities on MRI brain

## 2018-05-04 NOTE — Telephone Encounter (Signed)
-----   Message from Yijun Yan, MD sent at 05/04/2018  1:09 PM EDT ----- Please call patient, there is no significant abnormalities on MRI brain 

## 2018-05-04 NOTE — Telephone Encounter (Signed)
The patient has been sent an email with an invitation to sign up for mychart.  I called to let her know to be on the lookout for the email.  I also provided her with the mychart help desk phone number [phone]).

## 2018-05-04 NOTE — Telephone Encounter (Signed)
Spoke to patient and she is aware of her results.  She would like a phone call back from Dr. Terrace Arabia since the results were not completely normal.

## 2018-05-04 NOTE — Progress Notes (Signed)
Please call patient, there is no significant abnormalities on MRI brain

## 2018-05-04 NOTE — Telephone Encounter (Signed)
It is OK to dispense Xanax before MRI. 

## 2018-05-05 ENCOUNTER — Encounter: Payer: Self-pay | Admitting: *Deleted

## 2018-05-05 NOTE — Telephone Encounter (Signed)
The patient still has questions.  After her neuro exam, labs and MRI, do you think her memory loss is related to her anxiety?  Any neurological etiology that concerns you?

## 2018-05-05 NOTE — Telephone Encounter (Signed)
Memory loss Unprioritized Change Dx Resolve      Details Code: R41.3 Noted: 04/

## 2018-05-05 NOTE — Telephone Encounter (Signed)
I do not think there are neurology etiology for her complains, her headache has migraine features.  She should work with her PCP for her mood disorder

## 2018-05-05 NOTE — Telephone Encounter (Signed)
I provided the information below.  She verbalized understanding.  She wanted her MRI brain results released to [facility] and this request has been completed for her.

## 2018-05-05 NOTE — Telephone Encounter (Signed)
Patient stated that she got the invitation to sign up for mychart but her latest diagnosis are not on there. She stated she would like them on there so she can research them.

## 2018-05-19 ENCOUNTER — Telehealth: Payer: Self-pay | Admitting: Neurology

## 2018-05-19 NOTE — Telephone Encounter (Signed)
Called and spoke to patient she will follow up as needed she will call the office.

## 2018-05-19 NOTE — Telephone Encounter (Signed)
-----   Message from Levert Feinstein, MD sent at 04/21/2018  1:28 PM EDT ----- prn

## 2018-07-10 ENCOUNTER — Other Ambulatory Visit: Payer: Self-pay | Admitting: Neurology

## 2018-12-03 NOTE — Progress Notes (Deleted)
PATIENT: Lauren Duke DOB: 01-11-1985  REASON FOR VISIT: follow up HISTORY FROM: patient  No chief complaint on file.    HISTORY OF PRESENT ILLNESS: Today 12/03/18 Lauren Duke is a 33 y.o. female here today for follow up for memory loss. MRI was normal in 04/2018.   HISTORY: (copied from Dr Zannie Cove note on 04/21/2018)   Lauren Duke is a 33 years old female, seen in request by her OB/GYN Dr. 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, graduate with masters degree, since January 2020, she began to noticed gradual onset memory loss, has difficulty handling her job, she has difficulty taking on new concept, word finding difficulties, gradually getting worse over the past couple months, now she has trouble recalling recent event, given some of the long-term memory has slipped away, she also complains of blurry vision, was seen by ophthalmologist, was reported normal, she denies dysarthria, no lateralized motor or sensory deficit  She does complains of anxiety, for a while she has difficulty sleeping, not improved, has good appetite, is on multiple supplements,  She had a CAT scan of brain with without contrast on March 02, 2018 that was normal  Laboratory evaluations in 2019/2020 reviewed, normal TSH, CBC, CMP, lipid profile, LDL 82, protein electrophoresis.   REVIEW OF SYSTEMS: Out of a complete 14 system review of symptoms, the patient complains only of the following symptoms, and all other reviewed systems are negative.  ALLERGIES: No Known Allergies  HOME MEDICATIONS: Outpatient Medications Prior to Visit  Medication Sig Dispense Refill  . CALCIUM-VITAMIN D PO Take 1 tablet by mouth daily.    . Coenzyme Q10 (CO Q 10 PO) Take 1 tablet by mouth daily.    Marland Kitchen levonorgestrel (MIRENA) 20 MCG/24HR IUD 1 each by Intrauterine route once.    . ST JOHNS WORT PO Take 1 tablet by mouth daily.    Marland Kitchen venlafaxine XR  (EFFEXOR-XR) 37.5 MG 24 hr capsule TAKE 1 CAPSULE (37.5 MG TOTAL) BY MOUTH DAILY WITH BREAKFAST. 90 capsule 3   No facility-administered medications prior to visit.     PAST MEDICAL HISTORY: Past Medical History:  Diagnosis Date  . Memory loss   . Word finding difficulty     PAST SURGICAL HISTORY: No past surgical history on file.  FAMILY HISTORY: Family History  Problem Relation Age of Onset  . Healthy Mother   . Healthy Father     SOCIAL HISTORY: Social History   Socioeconomic History  . Marital status: Married    Spouse name: Not on file  . Number of children: 2  . Years of education: college  . Highest education level: Not on file  Occupational History  . Not on file  Social Needs  . Financial resource strain: Not on file  . Food insecurity    Worry: Not on file    Inability: Not on file  . Transportation needs    Medical: Not on file    Non-medical: Not on file  Tobacco Use  . Smoking status: Never Smoker  . Smokeless tobacco: Never Used  Substance and Sexual Activity  . Alcohol use: Not Currently    Alcohol/week: 0.0 standard drinks  . Drug use: Never  . Sexual activity: Not on file  Lifestyle  . Physical activity    Days per week: Not on file    Minutes per session: Not on file  . Stress: Not on file  Relationships  .  Social Herbalist on phone: Not on file    Gets together: Not on file    Attends religious service: Not on file    Active member of club or organization: Not on file    Attends meetings of clubs or organizations: Not on file    Relationship status: Not on file  . Intimate partner violence    Fear of current or ex partner: Not on file    Emotionally abused: Not on file    Physically abused: Not on file    Forced sexual activity: Not on file  Other Topics Concern  . Not on file  Social History Narrative   Lives at home with husband and two children.   Right-handed.   No caffeine use.      PHYSICAL EXAM  There  were no vitals filed for this visit. There is no height or weight on file to calculate BMI.  Generalized: Well developed, in no acute distress  Cardiology: normal rate and rhythm, no murmur noted Neurological examination  Mentation: Alert oriented to time, place, history taking. Follows all commands speech and language fluent Cranial nerve II-XII: Pupils were equal round reactive to light. Extraocular movements were full, visual field were full on confrontational test. Facial sensation and strength were normal. Uvula tongue midline. Head turning and shoulder shrug  were normal and symmetric. Motor: The motor testing reveals 5 over 5 strength of all 4 extremities. Good symmetric motor tone is noted throughout.  Sensory: Sensory testing is intact to soft touch on all 4 extremities. No evidence of extinction is noted.  Coordination: Cerebellar testing reveals good finger-nose-finger and heel-to-shin bilaterally.  Gait and station: Gait is normal. Tandem gait is normal. Romberg is negative. No drift is seen.  Reflexes: Deep tendon reflexes are symmetric and normal bilaterally.   DIAGNOSTIC DATA (LABS, IMAGING, TESTING) - I reviewed patient records, labs, notes, testing and imaging myself where available.  MMSE - Mini Mental State Exam 04/21/2018  Orientation to time 5  Orientation to Place 5  Registration 3  Attention/ Calculation 5  Recall 3  Language- name 2 objects 2  Language- repeat 1  Language- follow 3 step command 3  Language- read & follow direction 1  Write a sentence 0  Copy design 0  Total score 28     Lab Results  Component Value Date   WBC 16.1 (H) 11/12/2014   HGB 12.4 11/12/2014   HCT 35.6 (L) 11/12/2014   MCV 90.8 11/12/2014   PLT 214 11/12/2014   No results found for: NA, K, CL, CO2, GLUCOSE, BUN, CREATININE, CALCIUM, PROT, ALBUMIN, AST, ALT, ALKPHOS, BILITOT, GFRNONAA, GFRAA No results found for: CHOL, HDL, LDLCALC, LDLDIRECT, TRIG, CHOLHDL No results found  for: HGBA1C No results found for: VITAMINB12 No results found for: TSH     ASSESSMENT AND PLAN 33 y.o. year old female  has a past medical history of Memory loss and Word finding difficulty. here with ***  No diagnosis found.     No orders of the defined types were placed in this encounter.    No orders of the defined types were placed in this encounter.     I spent 15 minutes with the patient. 50% of this time was spent counseling and educating patient on plan of care and medications.    Debbora Presto, FNP-C 12/03/2018, 10:17 PM [facility] [address], [place], [place] [address] [phone]

## 2018-12-06 ENCOUNTER — Encounter (HOSPITAL_COMMUNITY): Payer: Self-pay

## 2018-12-06 ENCOUNTER — Telehealth: Payer: Self-pay | Admitting: Family Medicine

## 2018-12-06 ENCOUNTER — Ambulatory Visit: Payer: Commercial Managed Care - PPO | Admitting: Family Medicine

## 2018-12-06 ENCOUNTER — Emergency Department (HOSPITAL_COMMUNITY)
Admission: EM | Admit: 2018-12-06 | Discharge: 2018-12-07 | Disposition: A | Discharge status: Home / Self Care | Payer: Self-pay | Attending: Emergency Medicine | Admitting: Emergency Medicine

## 2018-12-06 ENCOUNTER — Other Ambulatory Visit: Payer: Self-pay

## 2018-12-06 DIAGNOSIS — Z046 Encounter for general psychiatric examination, requested by authority: Secondary | ICD-10-CM

## 2018-12-06 DIAGNOSIS — F419 Anxiety disorder, unspecified: Secondary | ICD-10-CM | POA: Diagnosis present

## 2018-12-06 DIAGNOSIS — F4329 Adjustment disorder with other symptoms: Secondary | ICD-10-CM

## 2018-12-06 DIAGNOSIS — Z79899 Other long term (current) drug therapy: Secondary | ICD-10-CM | POA: Insufficient documentation

## 2018-12-06 DIAGNOSIS — Z975 Presence of (intrauterine) contraceptive device: Secondary | ICD-10-CM | POA: Insufficient documentation

## 2018-12-06 DIAGNOSIS — F331 Major depressive disorder, recurrent, moderate: Secondary | ICD-10-CM | POA: Insufficient documentation

## 2018-12-06 DIAGNOSIS — Z20828 Contact with and (suspected) exposure to other viral communicable diseases: Secondary | ICD-10-CM | POA: Insufficient documentation

## 2018-12-06 HISTORY — DX: Depression, unspecified: F32.A

## 2018-12-06 LAB — URINALYSIS, ROUTINE W REFLEX MICROSCOPIC
Bacteria, UA: NONE SEEN
Bilirubin Urine: NEGATIVE
Glucose, UA: NEGATIVE mg/dL
Ketones, ur: NEGATIVE mg/dL
Leukocytes,Ua: NEGATIVE
Nitrite: POSITIVE — AB
Protein, ur: NEGATIVE mg/dL
Specific Gravity, Urine: 1.003 — ABNORMAL LOW (ref 1.005–1.030)
pH: 6 (ref 5.0–8.0)

## 2018-12-06 LAB — COMPREHENSIVE METABOLIC PANEL
ALT: 31 U/L (ref 0–44)
AST: 33 U/L (ref 15–41)
Albumin: 4.3 g/dL (ref 3.5–5.0)
Alkaline Phosphatase: 50 U/L (ref 38–126)
Anion gap: 9 (ref 5–15)
BUN: 10 mg/dL (ref 6–20)
CO2: 24 mmol/L (ref 22–32)
Calcium: 9.2 mg/dL (ref 8.9–10.3)
Chloride: 106 mmol/L (ref 98–111)
Creatinine, Ser: 0.6 mg/dL (ref 0.44–1.00)
GFR calc Af Amer: >60 mL/min (ref >60)
GFR calc non Af Amer: >60 mL/min (ref >60)
Glucose, Bld: 92 mg/dL (ref 70–99)
Potassium: 3.7 mmol/L (ref 3.5–5.1)
Sodium: 139 mmol/L (ref 135–145)
Total Bilirubin: 0.6 mg/dL (ref 0.3–1.2)
Total Protein: 6.9 g/dL (ref 6.5–8.1)

## 2018-12-06 LAB — CBC WITH DIFFERENTIAL/PLATELET
Abs Immature Granulocytes: 0.03 10*3/uL (ref 0.00–0.07)
Basophils Absolute: 0 10*3/uL (ref 0.0–0.1)
Basophils Relative: 1 %
Eosinophils Absolute: 0.1 10*3/uL (ref 0.0–0.5)
Eosinophils Relative: 1 %
HCT: 39.9 % (ref 36.0–46.0)
Hemoglobin: 14 g/dL (ref 12.0–15.0)
Immature Granulocytes: 0 %
Lymphocytes Relative: 21 %
Lymphs Abs: 1.5 10*3/uL (ref 0.7–4.0)
MCH: 33.9 pg (ref 26.0–34.0)
MCHC: 35.1 g/dL (ref 30.0–36.0)
MCV: 96.6 fL (ref 80.0–100.0)
Monocytes Absolute: 0.5 10*3/uL (ref 0.1–1.0)
Monocytes Relative: 7 %
Neutro Abs: 5.2 10*3/uL (ref 1.7–7.7)
Neutrophils Relative %: 70 %
Platelets: 267 10*3/uL (ref 150–400)
RBC: 4.13 MIL/uL (ref 3.87–5.11)
RDW: 12.1 % (ref 11.5–15.5)
WBC: 7.4 10*3/uL (ref 4.0–10.5)
nRBC: 0 % (ref 0.0–0.2)

## 2018-12-06 LAB — SALICYLATE LEVEL: Salicylate Lvl: <7 mg/dL (ref 2.8–30.0)

## 2018-12-06 LAB — ETHANOL: Alcohol, Ethyl (B): <10 mg/dL (ref <10)

## 2018-12-06 LAB — RAPID URINE DRUG SCREEN, HOSP PERFORMED
Amphetamines: NOT DETECTED
Barbiturates: NOT DETECTED
Benzodiazepines: NOT DETECTED
Cocaine: NOT DETECTED
Opiates: NOT DETECTED
Tetrahydrocannabinol: NOT DETECTED

## 2018-12-06 LAB — ACETAMINOPHEN LEVEL: Acetaminophen (Tylenol), Serum: <10 ug/mL — ABNORMAL LOW (ref 10–30)

## 2018-12-06 LAB — I-STAT BETA HCG BLOOD, ED (MC, WL, AP ONLY): I-stat hCG, quantitative: <5 m[IU]/mL (ref <5)

## 2018-12-06 MED ORDER — ACETAMINOPHEN 325 MG PO TABS: 650.0000 mg | ORAL_TABLET | ORAL | Status: DC | PRN

## 2018-12-06 MED ORDER — ONDANSETRON HCL 4 MG PO TABS: 4.0000 mg | ORAL_TABLET | Freq: Three times a day (TID) | ORAL | Status: DC | PRN

## 2018-12-06 MED ORDER — ALUM & MAG HYDROXIDE-SIMETH 200-200-20 MG/5ML PO SUSP: 30.0000 mL | Freq: Four times a day (QID) | ORAL | Status: DC | PRN

## 2018-12-06 NOTE — Telephone Encounter (Signed)
Please field call for me. Patient has appt today for follow up memory loss.

## 2018-12-06 NOTE — ED Notes (Signed)
Pt A&O x 4, talking on phone at present, pt upset & agitated, no distress noted, cooperative. 1-1 sitter remains at bedside.  Pt is IVC.  Monitoring for safety.
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
## 2018-12-06 NOTE — ED Triage Notes (Signed)
Pt reports that she had been taking medication for depression and stopped taking her med on 12/03/18. Pt states that her doctor recommended meds for 6 months and then she could go off of them and that is why she had stopped.
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
## 2018-12-06 NOTE — ED Notes (Signed)
Pt is at Nursing station using the phone at this time and was speaking with her lawyer and the police department regarding restraining order for her husband. [employer] officer present at this time.

## 2018-12-06 NOTE — ED Notes (Signed)
Pt Son Lauren Duke [phone])  called to get update on Mother status, pt is asleep at this

## 2018-12-06 NOTE — ED Triage Notes (Signed)
Pt arrived via [employer], Per Officer Pt husband is Raechel Chute for [employer] and asked provider to call for more information and was the one who took IVC papers out on Pt . Per IVC papers pt has been having hostile  behavior. Officer stated that pt requested a female provider and needed a female officer present for transport. Per [employer] calm and cooperative at this time.

## 2018-12-06 NOTE — ED Notes (Signed)
At pts request I have left a VM for Tom TTS to return call regarding her evaluation in order for her to know the plan due to day care issues.

## 2018-12-06 NOTE — ED Notes (Signed)
TTS called to report pt needs overnight observation, pt to be reassessed in am.  Pt remains irritable at present.

## 2018-12-06 NOTE — Telephone Encounter (Signed)
I spoke to husband.  He states that 4-5 months ago, since starting effexor for depression, or around that time she had change in behavior for the worse.  Personality change, has lost job, spent all her money, no social inhibitions, making rash decisions.  Not sure if related to effexor or other neuro issue.  If things dont change he may have to have her involuntary committed.  He is a Engineer, structural.  Wanted this information to be given pt [place] prior to appt.  Mother to come with pt.

## 2018-12-06 NOTE — ED Notes (Addendum)
Pt at nursing station and states to officer " who did you call, I saw you make a call and then come back and pulled the nurse aside out of my sight " [employer] officer  replied  to patient "I do not know what you are talking about.'

## 2018-12-06 NOTE — Telephone Encounter (Signed)
Pt's husband called wanting to speak with provider to inform her of some changes that he has noticed in his wife in the last 4 months. Husband Prisha Hiley was informed that there is no DPR on file stating that we can give him information on the pt and he understood that no info will be given to him but he does want to give information about her. Please advise.

## 2018-12-06 NOTE — ED Notes (Signed)
Pt arguing with staff regarding staying in room. She finally consented to go back in room,
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
## 2018-12-06 NOTE — Telephone Encounter (Signed)
Can you please make sure she was able to reschedule visit. She was > 15 minutes late today and was asked to reschedule.

## 2018-12-07 ENCOUNTER — Other Ambulatory Visit: Payer: Self-pay

## 2018-12-07 ENCOUNTER — Encounter (HOSPITAL_COMMUNITY): Payer: Self-pay | Admitting: Registered Nurse

## 2018-12-07 DIAGNOSIS — F4329 Adjustment disorder with other symptoms: Secondary | ICD-10-CM

## 2018-12-07 LAB — SARS CORONAVIRUS 2 (TAT 6-24 HRS): SARS Coronavirus 2: NEGATIVE

## 2018-12-07 NOTE — Discharge Instructions (Signed)
For your behavioral health needs, you are advised to follow up with your current outpatient providers. °
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
## 2018-12-07 NOTE — ED Notes (Signed)
Patient requesting to use personal cell phone. Educated patient no cell phone use allowed per [facility] policy. Patient becoming increasingly agitated and requesting charge nurse.
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
## 2018-12-07 NOTE — ED Notes (Signed)
Pt calm & cooperative at present, conversing with staff, appreciative of care.  1-1 sitter remains at bedside, monitoring for safety, no distress noted.

## 2018-12-08 NOTE — Telephone Encounter (Signed)
I called the patient. She had concerns about her mother scheduling her appointment. Discussed with patient.

## 2018-12-08 NOTE — Telephone Encounter (Signed)
Pt has called initially asking who scheduled her 12-20-18 appointment.  Pt was informed it was an office representative.  Pt asked to speak with her because the appointment was changed as a result of a conversation that representative have with her mother (that is not on her DPR).  Pt went on expressing her dissatisfaction with that experience.  Pt then began to express her dissatisfaction with the phone rep she was speaking to.  Pt fel the phone rep was horribly educated for a phone rep and she was not happy.  Pt is asking for a call from office management to discuss her dissatisfaction with the situation with her appointment and the bad experience she had with phone rep discussing this matter.

## 2018-12-08 NOTE — Telephone Encounter (Signed)
This pt was late for her appt.  She was rescheduled by check in staff for 12-20-18.

## 2018-12-20 ENCOUNTER — Ambulatory Visit: Payer: Self-pay | Admitting: Family Medicine

## 2019-02-07 ENCOUNTER — Ambulatory Visit (HOSPITAL_COMMUNITY)
Admission: RE | Admit: 2019-02-07 | Discharge: 2019-02-07 | Disposition: A | Discharge status: Home / Self Care | Payer: Self-pay | Attending: Psychiatry | Admitting: Psychiatry

## 2019-02-07 NOTE — BH Assessment (Addendum)
Pt presents to [facility] for assessment. She is accompanied by her mother, Rosemary Holms [phone]. Pt reports she has Covid. Mother wants pt to get help, but pt states she is going to leave & she does not need psychiatric tx. RN and mother report pt denies SI and HI. By phone from the lobby, pt's mother states she thinks her dtr is lying about having Covid so she doesn't have to get help. Mother reports pt states she has outpt tx and is to pick up medications, but they were too expensive. Mother reports she is not sure pt is making this up as well. Mother then states pt is getting violent. No scuffle or loud noise heard through phone. When asked if pt was hitting or throwing things, mother said "no, but she is starting to yell". Pt could only be heard coughing loudly. Per mother, pt then left lobby. Mother was advised to seek IVC through Magistrate if she thinks pt is a danger to self or others. Per chart note, pt had a + Covid test on 01/31/19.

## 2019-02-09 ENCOUNTER — Telehealth: Payer: Self-pay | Admitting: Neurology

## 2019-02-09 NOTE — Telephone Encounter (Signed)
I called and left the patient a detailed message (ok per DPR) that she needs to contact her PCP to discuss treatment for depression and/or anxiety. Provided our number to call back with any questions.

## 2019-02-09 NOTE — Telephone Encounter (Signed)
Pt has called re: her being off of faxine XR (EFFEXOR-XR) 37.5 MG 24 hr capsule since November.  Pt states family and friends are advising her she needs to be on a medication for her depression.  Pt is asking for a call from RN to discuss

## 2019-02-14 ENCOUNTER — Encounter: Payer: Self-pay | Admitting: Family Medicine

## 2019-02-14 ENCOUNTER — Ambulatory Visit: Payer: Self-pay | Admitting: Family Medicine

## 2019-03-08 ENCOUNTER — Emergency Department (HOSPITAL_COMMUNITY)
Admission: EM | Admit: 2019-03-08 | Discharge: 2019-03-09 | Attending: Emergency Medicine | Admitting: Emergency Medicine

## 2019-03-08 ENCOUNTER — Emergency Department (HOSPITAL_COMMUNITY)

## 2019-03-08 ENCOUNTER — Other Ambulatory Visit: Payer: Self-pay

## 2019-03-08 DIAGNOSIS — R1110 Vomiting, unspecified: Secondary | ICD-10-CM | POA: Insufficient documentation

## 2019-03-08 DIAGNOSIS — F3289 Other specified depressive episodes: Secondary | ICD-10-CM | POA: Diagnosis not present

## 2019-03-08 DIAGNOSIS — Y998 Other external cause status: Secondary | ICD-10-CM | POA: Insufficient documentation

## 2019-03-08 DIAGNOSIS — R519 Headache, unspecified: Secondary | ICD-10-CM | POA: Insufficient documentation

## 2019-03-08 DIAGNOSIS — T50901A Poisoning by unspecified drugs, medicaments and biological substances, accidental (unintentional), initial encounter: Secondary | ICD-10-CM | POA: Diagnosis present

## 2019-03-08 DIAGNOSIS — R1013 Epigastric pain: Secondary | ICD-10-CM | POA: Diagnosis not present

## 2019-03-08 DIAGNOSIS — X58XXXA Exposure to other specified factors, initial encounter: Secondary | ICD-10-CM | POA: Insufficient documentation

## 2019-03-08 DIAGNOSIS — Z79899 Other long term (current) drug therapy: Secondary | ICD-10-CM | POA: Diagnosis not present

## 2019-03-08 DIAGNOSIS — Y9389 Activity, other specified: Secondary | ICD-10-CM | POA: Insufficient documentation

## 2019-03-08 DIAGNOSIS — Z20822 Contact with and (suspected) exposure to covid-19: Secondary | ICD-10-CM | POA: Diagnosis not present

## 2019-03-08 DIAGNOSIS — T1491XA Suicide attempt, initial encounter: Secondary | ICD-10-CM | POA: Diagnosis not present

## 2019-03-08 DIAGNOSIS — T50902A Poisoning by unspecified drugs, medicaments and biological substances, intentional self-harm, initial encounter: Secondary | ICD-10-CM | POA: Diagnosis not present

## 2019-03-08 DIAGNOSIS — Y92143 Cell of prison as the place of occurrence of the external cause: Secondary | ICD-10-CM | POA: Diagnosis not present

## 2019-03-08 LAB — URINALYSIS, ROUTINE W REFLEX MICROSCOPIC
Bilirubin Urine: NEGATIVE
Glucose, UA: NEGATIVE mg/dL
Hgb urine dipstick: NEGATIVE
Ketones, ur: NEGATIVE mg/dL
Nitrite: NEGATIVE
Protein, ur: NEGATIVE mg/dL
Specific Gravity, Urine: 1.006 (ref 1.005–1.030)
pH: 8 (ref 5.0–8.0)

## 2019-03-08 LAB — RAPID URINE DRUG SCREEN, HOSP PERFORMED
Amphetamines: NOT DETECTED
Barbiturates: NOT DETECTED
Benzodiazepines: NOT DETECTED
Cocaine: NOT DETECTED
Opiates: NOT DETECTED
Tetrahydrocannabinol: NOT DETECTED

## 2019-03-08 LAB — COMPREHENSIVE METABOLIC PANEL
ALT: 24 U/L (ref 0–44)
AST: 21 U/L (ref 15–41)
Albumin: 4.3 g/dL (ref 3.5–5.0)
Alkaline Phosphatase: 48 U/L (ref 38–126)
Anion gap: 12 (ref 5–15)
BUN: 10 mg/dL (ref 6–20)
CO2: 26 mmol/L (ref 22–32)
Calcium: 9.4 mg/dL (ref 8.9–10.3)
Chloride: 104 mmol/L (ref 98–111)
Creatinine, Ser: 0.61 mg/dL (ref 0.44–1.00)
GFR calc Af Amer: >60 mL/min (ref >60)
GFR calc non Af Amer: >60 mL/min (ref >60)
Glucose, Bld: 85 mg/dL (ref 70–99)
Potassium: 3.5 mmol/L (ref 3.5–5.1)
Sodium: 142 mmol/L (ref 135–145)
Total Bilirubin: 0.6 mg/dL (ref 0.3–1.2)
Total Protein: 7.5 g/dL (ref 6.5–8.1)

## 2019-03-08 LAB — CBC WITH DIFFERENTIAL/PLATELET
Abs Immature Granulocytes: 0.02 10*3/uL (ref 0.00–0.07)
Basophils Absolute: 0 10*3/uL (ref 0.0–0.1)
Basophils Relative: 0 %
Eosinophils Absolute: 0.1 10*3/uL (ref 0.0–0.5)
Eosinophils Relative: 1 %
HCT: 39.6 % (ref 36.0–46.0)
Hemoglobin: 13.8 g/dL (ref 12.0–15.0)
Immature Granulocytes: 0 %
Lymphocytes Relative: 18 %
Lymphs Abs: 1.5 10*3/uL (ref 0.7–4.0)
MCH: 33.6 pg (ref 26.0–34.0)
MCHC: 34.8 g/dL (ref 30.0–36.0)
MCV: 96.4 fL (ref 80.0–100.0)
Monocytes Absolute: 0.6 10*3/uL (ref 0.1–1.0)
Monocytes Relative: 7 %
Neutro Abs: 6.1 10*3/uL (ref 1.7–7.7)
Neutrophils Relative %: 74 %
Platelets: 263 10*3/uL (ref 150–400)
RBC: 4.11 MIL/uL (ref 3.87–5.11)
RDW: 12 % (ref 11.5–15.5)
WBC: 8.3 10*3/uL (ref 4.0–10.5)
nRBC: 0 % (ref 0.0–0.2)

## 2019-03-08 LAB — RESPIRATORY PANEL BY RT PCR (FLU A&B, COVID)
Influenza A by PCR: NEGATIVE
Influenza B by PCR: NEGATIVE
SARS Coronavirus 2 by RT PCR: NEGATIVE

## 2019-03-08 LAB — VALPROIC ACID LEVEL
Valproic Acid Lvl: 63 ug/mL (ref 50.0–100.0)
Valproic Acid Lvl: 47 ug/mL — ABNORMAL LOW (ref 50.0–100.0)

## 2019-03-08 LAB — I-STAT BETA HCG BLOOD, ED (MC, WL, AP ONLY): I-stat hCG, quantitative: <5 m[IU]/mL (ref <5)

## 2019-03-08 LAB — SALICYLATE LEVEL: Salicylate Lvl: <7 mg/dL — ABNORMAL LOW (ref 7.0–30.0)

## 2019-03-08 LAB — ACETAMINOPHEN LEVEL: Acetaminophen (Tylenol), Serum: <10 ug/mL — ABNORMAL LOW (ref 10–30)

## 2019-03-08 LAB — ETHANOL: Alcohol, Ethyl (B): <10 mg/dL (ref <10)

## 2019-03-08 MED ORDER — SODIUM CHLORIDE (PF) 0.9 % IJ SOLN
INTRAMUSCULAR | Status: AC
  Filled 2019-03-08: qty 50

## 2019-03-08 MED ORDER — SODIUM CHLORIDE 0.9 % IV BOLUS
1000.0000 mL | Freq: Once | INTRAVENOUS | Status: AC
  Administered 2019-03-08: 1000 mL via INTRAVENOUS

## 2019-03-08 MED ORDER — IOHEXOL 350 MG/ML SOLN
100.0000 mL | Freq: Once | INTRAVENOUS | Status: AC | PRN
  Administered 2019-03-08: 100 mL via INTRAVENOUS

## 2019-03-08 MED ORDER — IBUPROFEN 200 MG PO TABS
600.0000 mg | ORAL_TABLET | Freq: Once | ORAL | Status: AC
  Administered 2019-03-08: 600 mg via ORAL
  Filled 2019-03-08: qty 3

## 2019-03-08 MED ORDER — ONDANSETRON HCL 4 MG/2ML IJ SOLN: 4.0000 mg | Freq: Once | INTRAMUSCULAR | Status: DC

## 2019-03-08 NOTE — ED Notes (Signed)
Pt ambulated to restroom, pt also provided with beverage

## 2019-03-08 NOTE — ED Notes (Signed)
Notified Rose at poison control that patient potentially took 5-10 of each: depakote 500mg  remeron 15mg  lamictal 25 mg Benadryl 25mg   Recommendations:  initial depakote and acetaminophen level Repeat In 4 hours. Collect CMP now. Collect EKG now.  Rose states symptoms should stay mild, but give fluids for tachycardia, benzos for any possible seizures. Benadryl can cause CNS depression, tachy, and urinary retention.   depakote needs to peak and taper off, this could take 6-12 hours depending on regular or ER depakote.  Monitor 6-12 hours

## 2019-03-08 NOTE — Discharge Instructions (Signed)
Follow-up with your behavioral health representatives [facility].  They can determine when it is appropriate for you to resume your medications.

## 2019-03-08 NOTE — ED Notes (Signed)
Patient reports she took her bipolar medications to overdose. Patient states she has been hoarding them since Saturday to take them today.

## 2019-03-08 NOTE — ED Triage Notes (Signed)
Per [facility] - patient brought in from [facility] -- patient took "all of her pills", unknown pills, unknown how many patient took. Patient c/o stomach pains.

## 2019-03-08 NOTE — ED Notes (Signed)
Pt ambulated to restroom with assistance of nurse tech and sheriff at beside
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
## 2019-03-09 LAB — LAMOTRIGINE LEVEL: Lamotrigine Lvl: <1 ug/mL — ABNORMAL LOW (ref 2.0–20.0)

## 2019-03-31 ENCOUNTER — Other Ambulatory Visit: Payer: Self-pay

## 2019-03-31 ENCOUNTER — Emergency Department (HOSPITAL_COMMUNITY)
Admission: EM | Admit: 2019-03-31 | Discharge: 2019-03-31 | Discharge status: Against Medical Advice | Attending: Emergency Medicine | Admitting: Emergency Medicine

## 2019-03-31 ENCOUNTER — Encounter (HOSPITAL_COMMUNITY): Payer: Self-pay

## 2019-03-31 ENCOUNTER — Emergency Department (HOSPITAL_COMMUNITY)

## 2019-03-31 DIAGNOSIS — F1410 Cocaine abuse, uncomplicated: Secondary | ICD-10-CM | POA: Insufficient documentation

## 2019-03-31 DIAGNOSIS — R0602 Shortness of breath: Secondary | ICD-10-CM | POA: Diagnosis not present

## 2019-03-31 DIAGNOSIS — R079 Chest pain, unspecified: Secondary | ICD-10-CM | POA: Diagnosis not present

## 2019-03-31 DIAGNOSIS — Z5320 Procedure and treatment not carried out because of patient's decision for unspecified reasons: Secondary | ICD-10-CM | POA: Diagnosis not present

## 2019-03-31 LAB — CBC WITH DIFFERENTIAL/PLATELET
Abs Immature Granulocytes: 0.01 10*3/uL (ref 0.00–0.07)
Basophils Absolute: 0.1 10*3/uL (ref 0.0–0.1)
Basophils Relative: 1 %
Eosinophils Absolute: 0.1 10*3/uL (ref 0.0–0.5)
Eosinophils Relative: 2 %
HCT: 38.2 % (ref 36.0–46.0)
Hemoglobin: 12.9 g/dL (ref 12.0–15.0)
Immature Granulocytes: 0 %
Lymphocytes Relative: 36 %
Lymphs Abs: 1.5 10*3/uL (ref 0.7–4.0)
MCH: 33.1 pg (ref 26.0–34.0)
MCHC: 33.8 g/dL (ref 30.0–36.0)
MCV: 97.9 fL (ref 80.0–100.0)
Monocytes Absolute: 0.3 10*3/uL (ref 0.1–1.0)
Monocytes Relative: 8 %
Neutro Abs: 2.3 10*3/uL (ref 1.7–7.7)
Neutrophils Relative %: 53 %
Platelets: 268 10*3/uL (ref 150–400)
RBC: 3.9 MIL/uL (ref 3.87–5.11)
RDW: 13 % (ref 11.5–15.5)
WBC: 4.3 10*3/uL (ref 4.0–10.5)
nRBC: 0 % (ref 0.0–0.2)

## 2019-03-31 LAB — BASIC METABOLIC PANEL
Anion gap: 12 (ref 5–15)
BUN: 11 mg/dL (ref 6–20)
CO2: 21 mmol/L — ABNORMAL LOW (ref 22–32)
Calcium: 8.7 mg/dL — ABNORMAL LOW (ref 8.9–10.3)
Chloride: 109 mmol/L (ref 98–111)
Creatinine, Ser: 0.58 mg/dL (ref 0.44–1.00)
GFR calc Af Amer: >60 mL/min (ref >60)
GFR calc non Af Amer: >60 mL/min (ref >60)
Glucose, Bld: 91 mg/dL (ref 70–99)
Potassium: 3.9 mmol/L (ref 3.5–5.1)
Sodium: 142 mmol/L (ref 135–145)

## 2019-03-31 LAB — I-STAT BETA HCG BLOOD, ED (MC, WL, AP ONLY): I-stat hCG, quantitative: <5 m[IU]/mL (ref <5)

## 2019-03-31 LAB — TROPONIN I (HIGH SENSITIVITY): Troponin I (High Sensitivity): 4 ng/L (ref <18)

## 2019-03-31 MED ORDER — LORAZEPAM 2 MG/ML IJ SOLN: 1.0000 mg | Freq: Once | INTRAMUSCULAR | Status: DC

## 2019-03-31 MED ORDER — SODIUM CHLORIDE 0.9 % IV BOLUS: 1000.0000 mL | Freq: Once | INTRAVENOUS | Status: DC

## 2019-03-31 NOTE — ED Notes (Signed)
PT refused to sign appropriate lines on the AMA. Officer witnessed this action  Copy of AMA given to officer Original sent medical records  PT leave in handcuffs with officer
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
## 2019-03-31 NOTE — ED Notes (Signed)
PT removed her IV access and monitors Screaming that she is discharging "AMA and going to jail." She states "you cant keep me against my will, I will sue all of you."  EDP notified

## 2019-03-31 NOTE — ED Triage Notes (Addendum)
Pt arrives via [facility] from [facility] in PD custody c/o of substernal chest pain after using 3g of cocaine approx. 2 hours ago. GCS 15, A&Ox4. VS: BP 122/76, HR 90, SPO2 98% RA, RR 15, CBG 108.

## 2019-04-01 ENCOUNTER — Other Ambulatory Visit: Payer: Self-pay | Admitting: Obstetrics and Gynecology

## 2019-04-01 ENCOUNTER — Ambulatory Visit (HOSPITAL_COMMUNITY)
Admission: RE | Admit: 2019-04-01 | Discharge: 2019-04-01 | Disposition: A | Discharge status: Home / Self Care | Payer: Self-pay | Attending: Psychiatry | Admitting: Psychiatry
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
## 2019-05-16 DIAGNOSIS — Z0289 Encounter for other administrative examinations: Secondary | ICD-10-CM

## 2019-05-18 ENCOUNTER — Ambulatory Visit: Payer: Self-pay | Admitting: Family Medicine

## 2019-05-18 ENCOUNTER — Encounter: Payer: Self-pay | Admitting: Family Medicine

## 2019-05-18 ENCOUNTER — Other Ambulatory Visit: Payer: Self-pay

## 2019-05-18 VITALS — BP 118/81 | HR 79 | Temp 96.4°F | Ht 65.0 in | Wt 130.0 lb

## 2019-05-18 DIAGNOSIS — F319 Bipolar disorder, unspecified: Secondary | ICD-10-CM

## 2019-05-18 NOTE — Progress Notes (Signed)
I have reviewed and agreed above plan. 

## 2019-05-18 NOTE — Progress Notes (Signed)
PATIENT: Lauren Duke DOB: 1985-02-26  REASON FOR VISIT: follow up HISTORY FROM: patient  Chief Complaint  Patient presents with  . Follow-up    pt here in rm 1 for a medication f/u. Pt is no longer having memory loss     HISTORY OF PRESENT ILLNESS: Today 05/18/19 Lauren Duke is a 34 y.o. female here today for follow up for subjective memory loss. She feels that memory concerns have improved. She was last seen by Dr Krista Blue in 04/2018. MRI was unremarkable. No concerns of cognitive disorders at that time and symptoms more consistent with mood disorder. She has been seen by Dr Shela Leff, [facility], for a second opinion as well as neuropsychology who both agreed that there were no indications for a significant cognitive disorder. She has been advised to manage anxiety and depression with PCP. She was seen by ER on 3/2 for suicide attempt and again on 3/25 for chest pain following cocaine use. She was incarcerated. She participated in a rehab program at [facility]. She was told that she had bipolar depression. They started her on divalproex, lamotrigine and mirtazapine. She did not like how she felt so now she is on Lithium and Zyprexa. She continues to have anxiety and depression. No SI/HI. She is scheduled to see psychiatry on 5/21. She has enough medication to get her to that appt. She does not have a PCP.    HISTORY: (copied from Dr Rhea Belton note on 04/21/2018)  Lauren Duke is a 34 years old female, seen in request by her OB/GYN Dr. 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, graduate with masters degree, since January 2020, she began to noticed gradual onset memory loss, has difficulty handling her job, she has difficulty taking on new concept, word finding difficulties, gradually getting worse over the past couple months, now she has trouble recalling recent event, given some of the long-term memory has slipped away, she also  complains of blurry vision, was seen by ophthalmologist, was reported normal, she denies dysarthria, no lateralized motor or sensory deficit  She does complains of anxiety, for a while she has difficulty sleeping, not improved, has good appetite, is on multiple supplements,  She had a CAT scan of brain with without contrast on March 02, 2018 that was normal  Laboratory evaluations in 2019/2020 reviewed, normal TSH, CBC, CMP, lipid profile, LDL 82, protein electrophoresis.   REVIEW OF SYSTEMS: Out of a complete 14 system review of symptoms, the patient complains only of the following symptoms, anxiety, depression, and all other reviewed systems are negative.  ALLERGIES: No Known Allergies  HOME MEDICATIONS: Outpatient Medications Prior to Visit  Medication Sig Dispense Refill  . LITHIUM CARBONATE PO Take by mouth.    . OLANZapine (ZYPREXA) 5 MG tablet Take 5 mg by mouth at bedtime.    . divalproex (DEPAKOTE) 500 MG DR tablet Take 500 mg by mouth 2 (two) times daily.    Marland Kitchen lamoTRIgine (LAMICTAL) 25 MG tablet Take 25 mg by mouth every other day.    . levonorgestrel (MIRENA) 20 MCG/24HR IUD 1 each by Intrauterine route once.    . mirtazapine (REMERON) 15 MG tablet Take 7.5 mg by mouth at bedtime.    Marland Kitchen venlafaxine XR (EFFEXOR-XR) 37.5 MG 24 hr capsule TAKE 1 CAPSULE (37.5 MG TOTAL) BY MOUTH DAILY WITH BREAKFAST. (Patient not taking: Reported on 12/06/2018) 90 capsule 3   No facility-administered medications prior to visit.  PAST MEDICAL HISTORY: Past Medical History:  Diagnosis Date  . Depression   . Memory loss   . Word finding difficulty     PAST SURGICAL HISTORY: History reviewed. No pertinent surgical history.  FAMILY HISTORY: Family History  Problem Relation Age of Onset  . Healthy Mother   . Healthy Father     SOCIAL HISTORY: Social History   Socioeconomic History  . Marital status: Divorced    Spouse name: Not on file  . Number of children: 2  . Years of  education: college  . Highest education level: Not on file  Occupational History  . Not on file  Tobacco Use  . Smoking status: Never Smoker  . Smokeless tobacco: Never Used  Substance and Sexual Activity  . Alcohol use: Not Currently    Alcohol/week: 0.0 standard drinks  . Drug use: Never  . Sexual activity: Not on file  Other Topics Concern  . Not on file  Social History Narrative   Lives at home with husband and two children.   Right-handed.   No caffeine use.   Social Determinants of Health   Financial Resource Strain:   . Difficulty of Paying Living Expenses:   Food Insecurity:   . Worried About Programme researcher, broadcasting/film/video in the Last Year:   . Barista in the Last Year:   Transportation Needs:   . Freight forwarder (Medical):   Marland Kitchen Lack of Transportation (Non-Medical):   Physical Activity:   . Days of Exercise per Week:   . Minutes of Exercise per Session:   Stress:   . Feeling of Stress :   Social Connections:   . Frequency of Communication with Friends and Family:   . Frequency of Social Gatherings with Friends and Family:   . Attends Religious Services:   . Active Member of Clubs or Organizations:   . Attends Banker Meetings:   Marland Kitchen Marital Status:   Intimate Partner Violence:   . Fear of Current or Ex-Partner:   . Emotionally Abused:   Marland Kitchen Physically Abused:   . Sexually Abused:       PHYSICAL EXAM  Vitals:   05/18/19 0851  BP: 118/81  Pulse: 79  Temp: (!) 96.4 F (35.8 C)  Weight: 130 lb (59 kg)  Height: 5\' 5"  (1.651 m)   Body mass index is 21.63 kg/m.  Generalized: Well developed, in no acute distress   Neurological examination  Mentation: Alert oriented to time, place, history taking. Follows all commands speech and language fluent Cranial nerve II-XII: Pupils were equal round reactive to light. Extraocular movements were full, visual field were full Motor: The motor testing reveals 5 over 5 strength of all 4 extremities.  Good symmetric motor tone is noted throughout.  Gait and station: Gait is normal.    DIAGNOSTIC DATA (LABS, IMAGING, TESTING) - I reviewed patient records, labs, notes, testing and imaging myself where available.  MMSE - Mini Mental State Exam 04/21/2018  Orientation to time 5  Orientation to Place 5  Registration 3  Attention/ Calculation 5  Recall 3  Language- name 2 objects 2  Language- repeat 1  Language- follow 3 step command 3  Language- read & follow direction 1  Write a sentence 0  Copy design 0  Total score 28     Lab Results  Component Value Date   WBC 4.3 03/31/2019   HGB 12.9 03/31/2019   HCT 38.2 03/31/2019   MCV 97.9 03/31/2019  PLT 268 03/31/2019      Component Value Date/Time   NA 142 03/31/2019 0700   K 3.9 03/31/2019 0700   CL 109 03/31/2019 0700   CO2 21 (L) 03/31/2019 0700   GLUCOSE 91 03/31/2019 0700   BUN 11 03/31/2019 0700   CREATININE 0.58 03/31/2019 0700   CALCIUM 8.7 (L) 03/31/2019 0700   PROT 7.5 03/08/2019 1322   ALBUMIN 4.3 03/08/2019 1322   AST 21 03/08/2019 1322   ALT 24 03/08/2019 1322   ALKPHOS 48 03/08/2019 1322   BILITOT 0.6 03/08/2019 1322   GFRNONAA >60 03/31/2019 0700   GFRAA >60 03/31/2019 0700   No results found for: CHOL, HDL, LDLCALC, LDLDIRECT, TRIG, CHOLHDL No results found for: RFFM3W No results found for: VITAMINB12 No results found for: TSH     ASSESSMENT AND PLAN 34 y.o. year old female  has a past medical history of Depression, Memory loss, and Word finding difficulty. here with     ICD-10-CM   1. Bipolar depression [facility])  F31.9     Mrs Tena presents today for follow-up of subjective memory complaints fortunately, she feels that memory has improved over the past year.  She has had some struggles with mental health and substance abuse but is now recovering.  She was followed by old Onnie Graham and now will be seen by outpatient psychiatry.  She has an appointment scheduled for next Friday.  She was encouraged  to continue with current treatment plan.  I have encouraged her to establish care with a primary care Domique Clapper in her area.  Healthy lifestyle habits encouraged.  She does not need to follow-up with our office.  She verbalizes understanding and agreement with this plan.   No orders of the defined types were placed in this encounter.    No orders of the defined types were placed in this encounter.     I spent 15 minutes with the patient. 50% of this time was spent counseling and educating patient on plan of care and medications.    Shawnie Dapper, FNP-C 05/18/2019, 9:52 AM [facility] [address] [place], [place] [address] [phone]

## 2019-06-13 ENCOUNTER — Other Ambulatory Visit: Payer: Self-pay

## 2019-06-13 ENCOUNTER — Ambulatory Visit (INDEPENDENT_AMBULATORY_CARE_PROVIDER_SITE_OTHER): Payer: No Payment, Other | Admitting: Licensed Clinical Social Worker

## 2019-06-13 DIAGNOSIS — F319 Bipolar disorder, unspecified: Secondary | ICD-10-CM | POA: Diagnosis not present

## 2019-06-13 DIAGNOSIS — F411 Generalized anxiety disorder: Secondary | ICD-10-CM

## 2019-06-13 NOTE — Progress Notes (Signed)
Comprehensive Clinical Assessment (CCA) Note  06/13/2019 Harneet Noblett [id number]  Visit Diagnosis:      ICD-10-CM   1. Bipolar I disorder with depression ([place])  F31.9   2. Generalized anxiety disorder  F41.1       CCA Screening, Triage and Referral (STR)  Patient Reported Information Referral name: Beverly Sessions   Whom do you see for routine medical problems? I don't have a doctor   What Do You Feel Would Help You the Most Today? Therapy;Medication   Have You Recently Been in Any Inpatient Treatment (Hospital/Detox/Crisis Center/28-Day Program)? Yes  Name/Location of Program/Hospital:[facility]  How Long Were You There? 1 week  When Were You Discharged? 04/12/19   Have You Ever Received Services From [employer] Before? Yes  Who Do You See at [facility]? Children were born here.   Have You Recently Had Any Thoughts About Hurting Yourself? No (Hx of attempted Suicide in jail March 2nd 2021)  Are You Planning to [place] At This time? No   Have you Recently Had Thoughts About [place]? No  Explanation: No data recorded  Have You Used Any Alcohol or Drugs in the Past 24 Hours? No   Do You Currently Have a Therapist/Psychiatrist? Yes  Name of Therapist/Psychiatrist: [place] Recently Discharged From Any Mudlogger or Programs? No     CCA Screening Triage Referral Assessment Type of Contact: Face-to-Face   Patient Reported Information Reviewed? Yes  Reason for Not Completing Assessment: multiple assessments    Is CPS involved or ever been involved? In the Past  Is APS involved or ever been involved? Never   Patient Determined To Be At Risk for Harm To Self or Others Based on Review of Patient Reported Information or Presenting Complaint? No   Location of Assessment: [facility]   Does Patient Present under Involuntary Commitment? No   [place] of Residence:  [place]   Patient Currently Receiving the Following Services: Medication Management      CCA Biopsychosocial  Intake/Chief Complaint:  CCA Intake With Chief Complaint CCA Part Two Date: 06/13/19 Chief Complaint/Presenting Problem: anxiety and bi polar with depression Patient's Currently Reported Symptoms/Problems: Mania, outside of body experience, panic attack, irritable, grandiosity, rapid speech Individual's Strengths: persavere when times are tough, curious, learning, personable, patient Type of Services Patient Feels Are Needed: TYherapy and medication mgnt  Mental Health Symptoms Depression:  Depression: Change in energy/activity, Sleep (too much or little), Fatigue, Increase/decrease in appetite  Mania:  Mania: Change in energy/activity, Euphoria, Overconfidence, Racing thoughts, Recklessness  Anxiety:   Anxiety: Difficulty concentrating, Restlessness, Tension, Worrying  Psychosis:     Trauma:     Obsessions:     Compulsions:     Inattention:     Hyperactivity/Impulsivity:     Oppositional/Defiant Behaviors:     Emotional Irregularity:     Other Mood/Personality Symptoms:      Mental Status Exam Appearance and self-care  Stature:  Stature: Small  Weight:  Weight: Thin  Clothing:  Clothing: Casual  Grooming:  Grooming: Normal  Cosmetic use:  Cosmetic Use: Age appropriate  Posture/gait:  Posture/Gait: Normal  Motor activity:     Sensorium  Attention:  Attention: Normal  Concentration:  Concentration: Normal  Orientation:  Orientation: X5  Recall/memory:  Recall/Memory: Normal  Affect and Mood  Affect:  Affect: Anxious, Flat  Mood:  Mood: Anxious  Relating  Eye contact:  Eye Contact: Avoided  Facial expression:  Facial Expression: Anxious  Attitude toward examiner:     Thought and Language  Speech flow:    Thought content:     Preoccupation:  Preoccupations: None  Hallucinations:  Hallucinations: None  Organization:     Transport planner of  Knowledge:  Fund of Knowledge: Average  Intelligence:  Intelligence: Average  Abstraction:  Abstraction: Normal  Judgement:  Judgement: Fair  Art therapist:  Reality Testing: Adequate  Insight:  Insight: Fair  Decision Making:  Decision Making: Impulsive(when manic episode occur)  Social Functioning  Social Maturity:  Social Maturity: Impulsive(when manic episode occur)  Social Judgement:  Social Judgement: Normal  Stress  Stressors:  Stressors: Family conflict, Housing, Scientist, research (physical sciences)  Coping Ability:  Coping Ability: English as a second language teacher Deficits:  Skill Deficits: Environmental health practitioner, Self-control  Supports:  Supports: Friends/Service system, Family     Religion: Religion/Spirituality Are You A Religious Person?: Yes What is Your Religious Affiliation?: [place] Orthodox  Leisure/Recreation: Leisure / Recreation Do You Have Hobbies?: Yes Leisure and Hobbies: Piano, Scientist, research (life sciences) by numbers,  Exercise/Diet: Exercise/Diet Do You Exercise?: No(Hx of running 10ks, marthons, but daily 3 miles) Have You Gained or Lost A Significant Amount of Weight in the Past Six Months?: Yes-Gained Number of Pounds Gained: 15 Do You Follow a Special Diet?: No Do You Have Any Trouble Sleeping?: No   CCA Employment/Education  Employment/Work Situation: Employment / Work Situation Employment situation: Unemployed Patient's job has been impacted by current illness: Yes Describe how patient's job has been impacted: Active license as a Engineer, 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[place]), however with manic episode and recent hospitalzation with felonies pending pt could loose license What is the longest time patient has a held a job?: 5 years Where was the patient employed at that time?: [employer] Has patient ever been in the [employer]?: No  Education: Education Last Grade Completed: 12 Did Teacher, adult education From [employer]?: Yes Did Physicist, medical?: Yes What Type of College Degree Do you Have?: accounting Did You Attend Graduate School?: Yes What is  Your Teacher, English as a foreign language Degree?: accounting Did You Have An Individualized Education Program (IIEP): No Did You Have Any Difficulty At School?: No Patient's Education Has Been Impacted by Current Illness: No   CCA Family/Childhood History  Family and Relationship History: Family history Marital status: Separated Separated, when?: Sept 12th on 2020 What types of issues is patient dealing with in the relationship?: trust issue, affair, Are you sexually active?: Yes What is your sexual orientation?: Heterosexual Has your sexual activity been affected by drugs, alcohol, medication, or emotional stress?: none Does patient have children?: Yes How many children?: 2 How is patient's relationship with their children?: non existent until court order are lifted, this has been a Paramedic in pt life  Childhood History:  Childhood History By whom was/is the patient raised?: Mother Description of patient's relationship with caregiver when they were a child: very good with mother Patient's description of current relationship with people who raised him/her: very good How were you disciplined when you got in trouble as a child/adolescent?: spanking and grounding Does patient have siblings?: Yes Number of Siblings: 1 Description of patient's current relationship with siblings: Older brother talk regular basis Did patient suffer any verbal/emotional/physical/sexual abuse as a child?: No Did patient suffer from severe childhood neglect?: No Has patient ever been sexually abused/assaulted/raped as an adolescent or adult?: No Was the patient ever a victim of a crime or a disaster?: No Witnessed domestic violence?: No Has patient been affected by domestic violence as  an adult?: No       CCA Substance Use  Alcohol/Drug Use: Hx with Alcohol during manic episode nothing current last drink reported over 2 months ago.                                DSM5 Diagnoses: Patient Active  Problem List   Diagnosis Date Noted  . Adjustment disorder with emotional disturbance 12/07/2018  . Memory loss 04/21/2018  . Anxiety 04/21/2018  . Chronic migraine 04/21/2018  . Patellofemoral crepitus 05/11/2017  . Pelvic joint pain, right 04/20/2017  . Left medial tibial stress syndrome vs stress fracture 03/17/2017  . Active labor 11/12/2014  . Postpartum care following vaginal delivery (11/6) 11/12/2014   Client is a 33 year old female. Client is referred by [facility]  for a Bi polar 1 with depression and generalized anxiety .   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   Assessment Information that integrates subjective and objective details with a therapist's professional interpretation: SW met with pt face to face for 60 min. Pt present with bi polar 1 with depression. Pt was alert and oriented x 5. All question were answer appropriately, pt was well groomed.  Pt states she has been unhappy with her marriage for years. 9 months ago She had multiple affairs and separated from her spouse. She has multiple legal charges pending due to her trying to take her children out of spouses custody. Currently pt has a restraining order against her through her spouse who she is separated from. 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 Currently she is staying with her mother, but plans on moving with her sig other "soon"    Client meets criteria for Bi-polar 1 with depression and generalized anxiety disorder due to listed above  symptoms   Pt only reports alcohol use to during manic episode.     Treatment recommendations are include plan Learn about self, prevent manic episode in future, increase coping mechnisms. Reduce overall level, frequency, and intensity of the anxiety so that daily functioning is not impaired; Stabilize anxiety level while increasing ability to function on a daily basis; Resolve the core conflict that is the source of anxiety    Clinician assisted client with scheduling the following appointments: 1 week . Clinician details of appointment.    Client was in agreement with treatment recommendations.    Patient Centered Plan: Patient is on the following Treatment Plan(s):  Anxiety    Referrals to Alternative Service(s): Referred to Alternative Service(s):   Place:   Date:   Time:    Referred to Alternative Service(s):   Place:   Date:   Time:    Referred to Alternative Service(s):   Place:   Date:   Time:    Referred to Alternative Service(s):   Place:   Date:   Time:     Dory Horn

## 2019-06-22 ENCOUNTER — Other Ambulatory Visit: Payer: Self-pay

## 2019-06-22 ENCOUNTER — Ambulatory Visit (INDEPENDENT_AMBULATORY_CARE_PROVIDER_SITE_OTHER): Payer: No Payment, Other | Admitting: Licensed Clinical Social Worker

## 2019-06-22 ENCOUNTER — Encounter (HOSPITAL_COMMUNITY): Payer: Self-pay | Admitting: Psychiatry

## 2019-06-22 ENCOUNTER — Ambulatory Visit (INDEPENDENT_AMBULATORY_CARE_PROVIDER_SITE_OTHER): Payer: No Payment, Other | Admitting: Psychiatry

## 2019-06-22 DIAGNOSIS — F319 Bipolar disorder, unspecified: Secondary | ICD-10-CM | POA: Diagnosis not present

## 2019-06-22 DIAGNOSIS — F411 Generalized anxiety disorder: Secondary | ICD-10-CM

## 2019-06-22 MED ORDER — LITHIUM CARBONATE ER 450 MG PO TBCR: 450.0000 mg | EXTENDED_RELEASE_TABLET | Freq: Every day | ORAL | 1 refills | Status: DC

## 2019-06-22 MED ORDER — OLANZAPINE 5 MG PO TABS: 5.0000 mg | ORAL_TABLET | Freq: Every day | ORAL | 1 refills | Status: DC

## 2019-06-22 NOTE — Patient Instructions (Signed)
Mixed Bipolar Disorder Mixed bipolar disorder is a mental health disorder in which a person has episodes of emotional highs (mania), lows (depression), or both of these feelings at the same time. People with this disorder have very big mood changes (mood swings) that happen quickly on a regular basis. These episodes may be severe enough to cause problems with relationships, school, or work. In some cases, they can cause the person to be unsafe, and the person may need to be hospitalized. What are the causes? The cause of this condition is not known. What increases the risk? The following factors may make you more likely to develop this condition:  Having a family history of the disorder.  Abusing substances such as alcohol or drugs.  Having an anxiety disorder.  Having another illness, such as heart disease or thyroid disease. What are the signs or symptoms? Symptoms of this condition include having episodes of mania, depression, and sometimes symptoms of both at the same time. For instance, you may feel sad and full of energy at the same time. You may have mood swings almost every day. Symptoms of mania may include:  Very high self-esteem or self-confidence.  Being unusually talkative, or feeling a need to keep talking. Speech may be very fast. It may seem like you cannot stop talking.  Racing thoughts or constant talking, with quick shifts between topics that may or may not be related (flight of ideas).  Decreased ability to focus or concentrate.  Increased purposeful activity, such as work, study, or social activity.  Increased nonproductive activity. This could be pacing, squirming and fidgeting, or finger and toe tapping.  Impulsive behavior and poor judgment. This may result in high-risk activities, such as having unprotected sex or spending a lot of money. Symptoms of depression may include:  Feeling sad, hopeless, or helpless.  Lack of feeling or caring about  anything.  Not being able to enjoy things that you used to enjoy.  Trouble concentrating or remembering.  Trouble making decisions.  Thoughts of death, or desire to harm yourself. How is this diagnosed? This condition may be diagnosed based on:  Your symptoms and medical history. Your health care provider will ask about your emotional episodes.  A physical exam. This is done to rule out any health problems that may be causing symptoms. Your health care provider will also ask about your alcohol and drug use. How is this treated? Bipolar disorder is a long-term (chronic) illness. It is best controlled with treatment that is given on an ongoing basis, rather than only when symptoms are present. Treatment may include:  Medicine. Medicine can be prescribed by a provider who specializes in treating mental disorders (psychiatrist). ? Medicines called mood stabilizers, antipsychotics, or antidepressants may be prescribed. ? If symptoms occur even while one type of medicine is taken, other medicines may be added.  Psychotherapy. Some forms of talk therapy, such as cognitive behavioral therapy (CBT), can provide support, education, and guidance.  Coping methods, such as journaling or relaxation exercises. These may include: ? Yoga. ? Meditation. ? Deep breathing.  Lifestyle changes, such as: ? Limiting alcohol and drug use. ? Exercising regularly. ? Getting plenty of sleep. ? Making healthy eating choices. A combination of medicine, talk therapy, and coping methods is best. In severe cases, if other treatments do not work, a procedure may be used to change the brain chemicals that send messages between brain cells (neurotransmitters). This procedure, called electroconvulsive therapy (ECT), applies short electrical pulses to the   brain through the scalp. Follow these instructions at home: Activity   Return to your normal activities as told by your health care provider.  Find activities  that you enjoy, and make time to do them.  Exercise regularly as told by your health care provider. Lifestyle  Follow a set schedule for eating and sleeping.  Eat a balanced diet that includes fresh fruits and vegetables, whole grains, low-fat dairy products, and lean meats.  Get 7-8 or more hours of sleep each night.  Do not drink alcohol or use illegal drugs. General instructions  Take over-the-counter and prescription medicines only as told by your health care provider.  Think about joining a support group. Your health care provider may be able to recommend a group for you.  Talk with your family and loved ones about your treatment goals and about how they can help.  Keep all follow-up visits as told by your health care provider. This is important. Contact a health care provider if:  Your symptoms get worse.  You have side effects from your medicine.  You have trouble sleeping.  You have trouble doing daily activities.  You feel unsafe in your surroundings.  You are dealing with substance abuse. Get help right away if:  You think about hurting yourself or you try to hurt yourself.  You think about suicide. If you ever feel like you may hurt yourself or others, or have thoughts about taking your own life, get help right away. You can go to your nearest emergency department or call:  Your local emergency services (911 in the [place]  A suicide crisis helpline, such as the National Suicide Prevention Lifeline at 1-800-273-8255. This is open 24 hours a day. Summary  Mixed bipolar disorder is a mental health disorder in which a person has episodes of emotional highs (mania), lows (depression), or both of these feelings at the same time.  Bipolar disorder is a long-term (chronic) illness. It is best controlled with treatment that is given on an ongoing basis, rather than only when symptoms are present.  A combination of medicine, talk therapy, and coping methods is best  for treating this condition. This information is not intended to replace advice given to you by your health care provider. Make sure you discuss any questions you have with your health care provider. Document Revised: 12/05/2016 Document Reviewed: 04/18/2016 Elsevier Patient Education  2020 Elsevier Inc.  

## 2019-06-22 NOTE — Progress Notes (Signed)
Psychiatric Initial Adult Assessment   Patient Identification: Lauren Duke MRN:  [id number] Date of Evaluation:  06/22/2019 Referral Source: Vesta Mixer Chief Complaint:  "I feel levled out and my medications are working for me". Visit Diagnosis: No diagnosis found.  History of Present Illness: 34 year old female seen today for initial psychiatric evaluation.  Patient was referred to outpatient psychiatry by [facility].  Today she notes that she feels leveled out and that her medications are effective in managing her psychiatric conditions.  Patient has a psychiatric history of depression, anxiety, and bipolar disorder.  She is currently managed on lithium carbonate 450 mg at bedtime and olanzapine 5 mg at bedtime.  Patient informed Clinical research associate that as she feels saddened by life stressors however notes that she believes her sadness is not depression.  She notes that she has been depressed in the past and notes that her current feelings are less severe than past depressive symptoms.  She states that during her manic state in in June/July 2020 she quit her job, informed her husband that she wanted a divorce, excessively spent money, sold her house, and had an affair.  She also notes that she has lost custody of her children who are 89 and 5.  She notes that at times she becomes anxious about upcoming court cases and outcomes of her past decisions she made during her manic phase.  She currently resides with her mother but notes that she will be moving in with her fianc soon.  Patient offered SSRI to help with mild depression however at this time patient declines additional medications.  She is agreeable to continue her medications as prescribed as well as follow-up with outpatient counselor for therapy.  No other concerns noted at this time.  Depression Symptoms:  anhedonia, anxiety, (Hypo) Manic Symptoms:  Denies Anxiety Symptoms:  Notes worrys about what will happen with court cases Psychotic Symptoms:   Denies PTSD Symptoms: NA  Past Psychiatric History: Anxiety, Depression, Bipolar disorder Previous Psychotropic Medications: Yes   Substance Abuse History in the last 12 months:  No.  Consequences of Substance Abuse: NA  Past Medical History:  Past Medical History:  Diagnosis Date  . Depression   . Memory loss   . Word finding difficulty    History reviewed. No pertinent surgical history.  Family Psychiatric History: Unknown  Family History:  Family History  Problem Relation Age of Onset  . Healthy Mother   . Healthy Father     Social History:   Social History   Socioeconomic History  . Marital status: Divorced    Spouse name: Not on file  . Number of children: 2  . Years of education: college  . Highest education level: Not on file  Occupational History  . Not on file  Tobacco Use  . Smoking status: Never Smoker  . Smokeless tobacco: Never Used  Substance and Sexual Activity  . Alcohol use: Not Currently    Alcohol/week: 0.0 standard drinks  . Drug use: Never  . Sexual activity: Not on file  Other Topics Concern  . Not on file  Social History Narrative   Lives at home with husband and two children.   Right-handed.   No caffeine use.   Social Determinants of Health   Financial Resource Strain:   . Difficulty of Paying Living Expenses:   Food Insecurity:   . Worried About Programme researcher, broadcasting/film/video in the Last Year:   . Barista in the Last Year:   Transportation Needs:   .  Lack of Transportation (Medical):   Marland Kitchen Lack of Transportation (Non-Medical):   Physical Activity:   . Days of Exercise per Week:   . Minutes of Exercise per Session:   Stress:   . Feeling of Stress :   Social Connections:   . Frequency of Communication with Friends and Family:   . Frequency of Social Gatherings with Friends and Family:   . Attends Religious Services:   . Active Member of Clubs or Organizations:   . Attends Archivist Meetings:   Marland Kitchen Marital Status:      Additional Social History: Patient is in the process of a divorce.  She has 2 young ages 20 and 58.  She currently resides with her mother however notes that she will be moving in with her fianc.  She denies tobacco, alcohol, or illicit drug use.  Allergies:  No Known Allergies  Metabolic Disorder Labs: No results found for: HGBA1C, MPG No results found for: PROLACTIN No results found for: CHOL, TRIG, HDL, CHOLHDL, VLDL, LDLCALC No results found for: TSH  Therapeutic Level Labs: No results found for: LITHIUM No results found for: CBMZ Lab Results  Component Value Date   VALPROATE 47 (L) 03/08/2019    Current Medications: Current Outpatient Medications  Medication Sig Dispense Refill  . LITHIUM CARBONATE PO Take by mouth.    . OLANZapine (ZYPREXA) 5 MG tablet Take 5 mg by mouth at bedtime.     No current facility-administered medications for this visit.    Musculoskeletal: Strength & Muscle Tone: within normal limits Gait & Station: normal Patient leans: N/A  Psychiatric Specialty Exam: Review of Systems  unknown if currently breastfeeding.There is no height or weight on file to calculate BMI.  General Appearance: Well Groomed  Eye Contact:  Good  Speech:  Clear and Coherent and Normal Rate  Volume:  Normal  Mood:  Depressed  Affect:  Congruent  Thought Process:  Coherent, Goal Directed and Linear  Orientation:  NA  Thought Content:  WDL and Logical  Suicidal Thoughts:  No  Homicidal Thoughts:  No  Memory:  Immediate;   Good Recent;   Good Remote;   Good  Judgement:  Good  Insight:  Good  Psychomotor Activity:  Normal  Concentration:  Concentration: Good and Attention Span: Good  Recall:  Good  Fund of Knowledge:Good  Language: Good  Akathisia:  No  Handed:  Right  AIMS (if indicated):  Not done  Assets:  Desire for Improvement Housing Social Support  ADL's:  Intact  Cognition: WNL  Sleep:  Good   Screenings: CAGE-AID     Counselor from  06/13/2019 in [facility]  CAGE-AID Score 0    GAD-7     Counselor from 06/13/2019 in [facility]  Total GAD-7 Score 2    Mini-Mental     Office Visit from 04/21/2018 in [facility]  Total Score (max 30 points ) 28    PHQ2-9     Counselor from 06/13/2019 in [facility]  PHQ-2 Total Score 2      Assessment and Plan: Patient reports that she has been feeling sad due to life stressors.  At this time she however notes that she is stable on her current medication regimen and would not like medications adjusted at this time.  She is agreeable to continue medications as prescribed.  1. Bipolar I disorder with depression ([place])  Continue- lithium carbonate (ESKALITH) 450 MG CR  tablet; Take 1 tablet (450 mg total) by mouth at bedtime.  Dispense: 30 tablet; Refill: 1 Continue- OLANZapine (ZYPREXA) 5 MG tablet; Take 1 tablet (5 mg total) by mouth at bedtime.  Dispense: 30 tablet; Refill: 1  Follow-up in 1 month Follow-up with therapy  Shanna Cisco, NP 6/16/202111:08 AM

## 2019-06-22 NOTE — Progress Notes (Signed)
   THERAPIST PROGRESS NOTE  Session Time: 80  Participation Level: Active  Behavioral Response: CasualAlertAnxious and but willing to engage  Type of Therapy: Individual Therapy  Treatment Goals addressed: Diagnosis: bipolar with depression   Interventions: CBT  Summary: Lauren Duke is a 34 y.o. female who presents with bipolar depression.   Suicidal/Homicidal: Nowithout intent/plan    Plan: Return again in 2 weeks.  Diagnosis: Axis I: Bipolar, mixed    Axis II: No diagnosis  Subjective: Pt reports that she has recently closed on house with fianc "Lauren Duke". States that because they are not married, she does not want to put a lot of money into the house until the marriage happens. Pt reports she does want to keep banking separate for her 2nd marriage. This is due to her Hx with bipolar depression and manic episodes that pt has had in the past.  Pt states that despite her bipolar Dx "I have to look at the glass half full". She has taken a step back from a year ago in regards from her relationships and looks. Pt states she wants to move forward and not look back at the negative.   Objective: Pt is alert: oriented x 5 actively participating during today's session as indicated by positive responses and prompt replies. Pt has flat affect. Good hygiene by pt and dressed casually   Assessment: pt present with increased symptoms of bipolar depression. Manic criteria not present, however depression symptoms are decreased mood, increased appetite, lack of energy, sleepiness, anxiety, and low motivation. Symptoms were reported to Psych NP for 11AM appointment for proper medication adjustment and pt will continued to see therapist on weekly basis. CBT intervention was utilized replacing negative thought process with positive outlook. LCSW discussed social supports. Pt reports that she is loved, her children are well taken care of, she has a supporting fianc, and she is healthy.   Plan:  Continued meds as prescribed by provider; Journal 2 to 3 times weekly. Write down 5 positive adjectives about herself for next session.    [name], LCSW 06/22/2019

## 2019-07-06 ENCOUNTER — Ambulatory Visit (INDEPENDENT_AMBULATORY_CARE_PROVIDER_SITE_OTHER): Payer: No Payment, Other | Admitting: Licensed Clinical Social Worker

## 2019-07-06 ENCOUNTER — Other Ambulatory Visit: Payer: Self-pay

## 2019-07-06 DIAGNOSIS — F319 Bipolar disorder, unspecified: Secondary | ICD-10-CM

## 2019-07-06 NOTE — Progress Notes (Signed)
   THERAPIST PROGRESS NOTE  Session Time: 60  Subjective/Objective: Pt was alert and oriented x 5. Dressed casually, with flat depressed affect. Pt engaged throughout assessment. Lauren Duke states that she is worried that she will have to be on her medications her whole life and does not feel like this has to be the case. LCSW explained that although symptoms may not have been noticed until later in life bipolar disorder is acute. It was explained that through behavior therapy and the right combination of mood stabilizers and other bipolar medicines, most people with bipolar disorder can live normal, productive lives and control the illness. Lauren Duke admits that this has been hard to deal with along with the other stressor ranging from financial, relationship, social, family, and work. Pt reports over the next month she has multiple court dates for 30 charges that are pending including several felonies. 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 Charge all stem from an 47-month period where pt believes she was going through manic and psychotic symptoms. Pt reports she is currently in the process of a divorce and cannot have her kids at her current resident due to previous history with current significant other with whom she currently lives with. It is reported she can only see her kids supervised with pt mother present, but pt does state her top priorities are her children first and then her legal charges 2nd.  Current relationship is causing stress in pt life, but pt states this is not her top priority.   Assessment: Pt endorses symptoms of depression that range from sadness, hopelessness, and worthwhileness along with anxiety driven symptoms: worry, irritable, stress, and rapid thoughts. Pt denied any symptoms mania. Pt still meets criteria for bipolar depression.  Lauren Duke  denies suicide and homicidal ideations.   Plan: Continued to take medications as prescribed, meet monthly with medication management team and 2 times monthly for therapy. Pt will f/u with court dates over the next month. Pt will track symptoms for further therapy and medication adjustments that maybe needed.  Participation Level: Active  Behavioral Response: CasualAlertAnxious and Depressed  Type of Therapy: Individual Therapy  Treatment Goals addressed: Anxiety, Coping and Diagnosis: bipolar   Interventions: CBT  Summary: Lauren Duke is a 34 y.o. female who presents with bipolar depression.   Suicidal/Homicidal: Nowithout intent/plan   Plan: Return again in 3 weeks.  Diagnosis: Axis I: Bipolar, mixed        [name], LCSW 07/06/2019

## 2019-07-21 ENCOUNTER — Ambulatory Visit (INDEPENDENT_AMBULATORY_CARE_PROVIDER_SITE_OTHER): Payer: No Payment, Other | Admitting: Psychiatry

## 2019-07-21 ENCOUNTER — Other Ambulatory Visit: Payer: Self-pay

## 2019-07-21 ENCOUNTER — Encounter (HOSPITAL_COMMUNITY): Payer: Self-pay | Admitting: Psychiatry

## 2019-07-21 DIAGNOSIS — F319 Bipolar disorder, unspecified: Secondary | ICD-10-CM

## 2019-07-21 NOTE — Progress Notes (Signed)
BH MD/PA/NP OP Progress Note  07/21/2019 5:26 PM Lauren Duke  MRN:  [id number]  Chief Complaint: " I feel blah.  I have no drive to do anything."    HPI: 34 year old female seen today for followup psychiatric evaluation.  She has a psychiatric history of adjustment disorder, anxiety,  bipolar 1 disorder, and depression. She is currently being managed on lithium 450 at bedtime and Zyprexa 5 mg at bedtime. Today she notes that she has been taking lithium to 225 mg and Zyprexa 2.5 mg for 3 months and notes that she has been feeling depressed.  She informed Clinical research associate that she feared running out of the medication so decided to cut that in half.  She also reports  Clinical research associate that at her last visit she was dishonest with how she was feeling.    Today patient endorses depressive symptoms such as anhedonia, difficulty concentrating, fatigue, and feelings of worthlessness.  She notes that she excessively worries and is anxious about her pending court cases.  She notes that she has over 30 cases pending and worries about going back to jail.  She reports that she move in with her boyfriend however notes the move was unsuccessful and she moved back with her mother. She denies SI/HI/VAH.    Patient offered antidepressant to help with depressive symptoms however she declines noting that she fears it will induce mania.  Patient is agreeable to starting Vraylar 1.3 mg daily to help with symptoms of depression. She will discontinue Zyprexa. Potential side effects of medication and risks vs benefits of treatment vs non-treatment were explained and discussed. All questions were answered. She is agreeable to continue her medications as prescribed as well as follow-up with outpatient counselor for therapy.  No other concerns noted at this time.  Visit Diagnosis:    ICD-10-CM   1. Bipolar I disorder with depression (HCC)  F31.9 Lithium level    Past Psychiatric History: Anxiety, Depression, Bipolar disorder  Past Medical  History:  Past Medical History:  Diagnosis Date  . Depression   . Memory loss   . Word finding difficulty    History reviewed. No pertinent surgical history.  Family Psychiatric History: Unknown  Family History:  Family History  Problem Relation Age of Onset  . Healthy Mother   . Healthy Father     Social History:  Social History   Socioeconomic History  . Marital status: Divorced    Spouse name: Not on file  . Number of children: 2  . Years of education: college  . Highest education level: Not on file  Occupational History  . Not on file  Tobacco Use  . Smoking status: Never Smoker  . Smokeless tobacco: Never Used  Substance and Sexual Activity  . Alcohol use: Not Currently    Alcohol/week: 0.0 standard drinks  . Drug use: Never  . Sexual activity: Not on file  Other Topics Concern  . Not on file  Social History Narrative   Lives at home with husband and two children.   Right-handed.   No caffeine use.   Social Determinants of Health   Financial Resource Strain:   . Difficulty of Paying Living Expenses:   Food Insecurity:   . Worried About Programme researcher, broadcasting/film/video in the Last Year:   . Barista in the Last Year:   Transportation Needs:   . Freight forwarder (Medical):   Marland Kitchen Lack of Transportation (Non-Medical):   Physical Activity:   . Days of Exercise  per Week:   . Minutes of Exercise per Session:   Stress:   . Feeling of Stress :   Social Connections:   . Frequency of Communication with Friends and Family:   . Frequency of Social Gatherings with Friends and Family:   . Attends Religious Services:   . Active Member of Clubs or Organizations:   . Attends Banker Meetings:   Marland Kitchen Marital Status:     Allergies: No Known Allergies  Metabolic Disorder Labs: No results found for: HGBA1C, MPG No results found for: PROLACTIN No results found for: CHOL, TRIG, HDL, CHOLHDL, VLDL, LDLCALC No results found for: TSH  Therapeutic Level  Labs: No results found for: LITHIUM Lab Results  Component Value Date   VALPROATE 47 (L) 03/08/2019   VALPROATE 63 03/08/2019   No components found for:  CBMZ  Current Medications: Current Outpatient Medications  Medication Sig Dispense Refill  . lithium carbonate (ESKALITH) 450 MG CR tablet Take 1 tablet (450 mg total) by mouth at bedtime. 30 tablet 1   No current facility-administered medications for this visit.     Musculoskeletal: Strength & Muscle Tone: within normal limits Gait & Station: normal Patient leans: N/A  Psychiatric Specialty Exam: Review of Systems  unknown if currently breastfeeding.There is no height or weight on file to calculate BMI.  General Appearance: Well Groomed  Eye Contact:  Good  Speech:  Clear and Coherent and Normal Rate  Volume:  Normal  Mood:  Depressed  Affect:  Congruent  Thought Process:  Coherent, Goal Directed and Linear  Orientation:  Full (Time, Place, and Person)  Thought Content: WDL and Logical   Suicidal Thoughts:  No  Homicidal Thoughts:  No  Memory:  Immediate;   Good Recent;   Good Remote;   Good  Judgement:  Good  Insight:  Good  Psychomotor Activity:  Normal  Concentration:  Concentration: Good and Attention Span: Good  Recall:  Good  Fund of Knowledge: Good  Language: Good  Akathisia:  No  Handed:  Right  AIMS (if indicated): Not done  Assets:  Communication Skills Desire for Improvement Financial Resources/Insurance Housing Social Support  ADL's:  Intact  Cognition: WNL  Sleep:  Good   Screenings: CAGE-AID     Counselor from 06/13/2019 in [facility]  CAGE-AID Score 0    GAD-7     Counselor from 06/13/2019 in [facility]  Total GAD-7 Score 2    Mini-Mental     Office Visit from 04/21/2018 in [facility]  Total Score (max 30 points ) 28    PHQ2-9     Counselor from 06/13/2019 in [facility]   PHQ-2 Total Score 2       Assessment and Plan: Patient endorses symptoms of depression and anxiety.  She is agreeable to taking lithium 450 mg as prescribed, discontinuing Zyprexa and starting Vraylar 1.5 mg to help manage her mood.  Prescription not refilled today as patient notes that she has a 30-day supply of lithium at home.  Patient given a sample of Vraylar and will follow up in 2 weeks for further assessment and medication management.  1. Bipolar I disorder with depression (HCC)  - Lithium level  Follow-up in 2 weeks Shanna Cisco, NP 07/21/2019, 5:26 PM

## 2019-07-23 LAB — LITHIUM LEVEL: Lithium Lvl: <0.1 mmol/L — ABNORMAL LOW (ref 0.5–1.2)

## 2019-07-26 ENCOUNTER — Ambulatory Visit (INDEPENDENT_AMBULATORY_CARE_PROVIDER_SITE_OTHER): Payer: No Payment, Other | Admitting: Licensed Clinical Social Worker

## 2019-07-26 ENCOUNTER — Other Ambulatory Visit: Payer: Self-pay

## 2019-07-26 DIAGNOSIS — F319 Bipolar disorder, unspecified: Secondary | ICD-10-CM | POA: Diagnosis not present

## 2019-07-26 NOTE — Progress Notes (Signed)
   THERAPIST PROGRESS NOTE  Session Time: 38  Therapist Response:   Subjective:  Subjective:  Pt reports that she has had a significant life event since her previous visit. She states "I have moved out from [facility] house". Franky Macho if pt now ex-boyfriend who she started dating after she had separated from her current spouse. She says "It was during my manic phase and I knew that I could possibly lose my kids. I will always choose my kids". Pt reported previously that [name] the father of her children and separated spouse did not approve of her relationship and has never allowed the kids to be present while Jae Dire significant other "Franky Macho" was around.    She will be finish moving out on Saturday with her mother. Jae Dire is also reporting that since her break up her current spouse who she is separated from has indicated a possibility of reconciliation of their relationship, pt states this has added to her stress along with her already pending court proceeding which were pushed back to next month for continuation.     Objective: Jae Dire was alert and oriented x 5, casually dressed with full range of affect/mood. She was engaged throughout assessment.    Assessment: Pt endorses depressive symptoms of oversleeping, lack of motivation, sadness, and irritability. She reports that she has been taking all medications as directed with exception to Vraylar which she reports is causing nausea & night sweats. LCSW spoke with provider Gretchen Short NP who indicated that she could take medication every other day until follow up. Pt was provided Psychoeducational resources of bipolar disorder to provide better understanding of disorder. Pt continue to meet criteria for bipolar 1 disorder      Plan: Pt to follow up with medication mgmt 7/26, pt to journal symptoms overt the next 7 day to give to provider, pt to attempt job search for part time or full time employment  Participation Level: Active  Behavioral Response: Casual  and Fairly GroomedAlertEuphoric  Type of Therapy: Individual Therapy  Treatment Goals addressed: Diagnosis: Bipolar disorder   Interventions: CBT and Supportive  Summary: Delesha Pohlman is a 34 y.o. female who presents with bipolar disorder.   Suicidal/Homicidal: Nowithout intent/plan   Plan: Return again in 3  weeks.  Diagnosis: Axis I: Bipolar, Depressed       [name], LCSW 07/26/2019

## 2019-08-01 ENCOUNTER — Encounter (HOSPITAL_COMMUNITY): Payer: No Payment, Other | Admitting: Psychiatry

## 2019-08-10 ENCOUNTER — Ambulatory Visit (INDEPENDENT_AMBULATORY_CARE_PROVIDER_SITE_OTHER): Payer: No Payment, Other | Admitting: Licensed Clinical Social Worker

## 2019-08-10 ENCOUNTER — Other Ambulatory Visit: Payer: Self-pay

## 2019-08-10 ENCOUNTER — Encounter (HOSPITAL_COMMUNITY): Payer: Self-pay | Admitting: Psychiatry

## 2019-08-10 ENCOUNTER — Ambulatory Visit (INDEPENDENT_AMBULATORY_CARE_PROVIDER_SITE_OTHER): Payer: No Payment, Other | Admitting: Psychiatry

## 2019-08-10 DIAGNOSIS — F319 Bipolar disorder, unspecified: Secondary | ICD-10-CM

## 2019-08-10 MED ORDER — LITHIUM CARBONATE ER 450 MG PO TBCR: 450.0000 mg | EXTENDED_RELEASE_TABLET | Freq: Every day | ORAL | 2 refills | Status: DC

## 2019-08-10 MED ORDER — CARIPRAZINE HCL 3 MG PO CAPS: 3.0000 mg | ORAL_CAPSULE | Freq: Every day | ORAL | 2 refills | Status: DC

## 2019-08-10 NOTE — Progress Notes (Signed)
BH MD/PA/NP OP Progress Note  08/10/2019 4:34 PM Lauren Duke  MRN:  [id number]  Chief Complaint: " I feel blah and my depression is the same."    HPI: 34 year old female seen today for followup psychiatric evaluation.  She has a psychiatric history of adjustment disorder, anxiety,  bipolar 1 disorder, and depression. She is currently being managed on lithium 450 at bedtime and Vraylar 1.5 daily.   Today patient endorses depressive symptoms such as anhedonia, difficulty concentrating, fatigue, feelings of worthlessness and passive SI.  She states that when she is depressed she believes life is not worth living.  At this time she contracts for safety and denies having a plan to harm herself.  She denies SI/HI/VAH.  She informed Clinical research associate that she is unaware if the Leafy Kindle has been effective.  She notes that she still takes the medication every other day.  Provider instructed patient to take the medication daily.  Provider also informed patient that she received her lithium level results and informed her that her lithium levels were low at 0.1.  Provider informed patient that if the levels are too low her medications may be ineffective and potentially causing her to feel depressed or mania may reoccur.  She endorsed understanding and notes that she has been taking the medications regularly for the last 2 weeks.  Provider informed patient that she would like to have her lithium levels redrawn.  She endorsed understanding and notes that she will have them drawn when she can afford to go back to the lab.    Today patient notes that she excessively worries about her pending court cases which she will attend tomorrow. Patient asked provider to provider her with a letter documenting her mental health condition. Writer provided patient a note.    Patient is agreeable to increasing Vraylar 1.3 mg to 3 mg daily to help with symptoms of depression. Potential side effects of medication and risks vs benefits of treatment  vs non-treatment were explained and discussed. All questions were answered. She is agreeable to continue her medications as prescribed as well as follow-up with outpatient counselor for therapy.  No other concerns noted at this time.  Visit Diagnosis:    ICD-10-CM   1. Bipolar I disorder with depression (HCC)  F31.9 cariprazine (VRAYLAR) capsule    lithium carbonate (ESKALITH) 450 MG CR tablet    Lithium level    Past Psychiatric History: Anxiety, Depression, Bipolar disorder  Past Medical History:  Past Medical History:  Diagnosis Date  . Depression   . Memory loss   . Word finding difficulty    History reviewed. No pertinent surgical history.  Family Psychiatric History: Unknown  Family History:  Family History  Problem Relation Age of Onset  . Healthy Mother   . Healthy Father     Social History:  Social History   Socioeconomic History  . Marital status: Divorced    Spouse name: Not on file  . Number of children: 2  . Years of education: college  . Highest education level: Not on file  Occupational History  . Not on file  Tobacco Use  . Smoking status: Never Smoker  . Smokeless tobacco: Never Used  Substance and Sexual Activity  . Alcohol use: Not Currently    Alcohol/week: 0.0 standard drinks  . Drug use: Never  . Sexual activity: Not on file  Other Topics Concern  . Not on file  Social History Narrative   Lives at home with husband and two children.  Right-handed.   No caffeine use.   Social Determinants of Health   Financial Resource Strain:   . Difficulty of Paying Living Expenses:   Food Insecurity:   . Worried About Programme researcher, broadcasting/film/video in the Last Year:   . Barista in the Last Year:   Transportation Needs:   . Freight forwarder (Medical):   Marland Kitchen Lack of Transportation (Non-Medical):   Physical Activity:   . Days of Exercise per Week:   . Minutes of Exercise per Session:   Stress:   . Feeling of Stress :   Social Connections:    . Frequency of Communication with Friends and Family:   . Frequency of Social Gatherings with Friends and Family:   . Attends Religious Services:   . Active Member of Clubs or Organizations:   . Attends Banker Meetings:   Marland Kitchen Marital Status:     Allergies: No Known Allergies  Metabolic Disorder Labs: No results found for: HGBA1C, MPG No results found for: PROLACTIN No results found for: CHOL, TRIG, HDL, CHOLHDL, VLDL, LDLCALC No results found for: TSH  Therapeutic Level Labs: Lab Results  Component Value Date   LITHIUM <0.1 (L) 07/22/2019   Lab Results  Component Value Date   VALPROATE 47 (L) 03/08/2019   VALPROATE 63 03/08/2019   No components found for:  CBMZ  Current Medications: Current Outpatient Medications  Medication Sig Dispense Refill  . cariprazine (VRAYLAR) capsule Take 1 capsule (3 mg total) by mouth daily. 30 capsule 2  . lithium carbonate (ESKALITH) 450 MG CR tablet Take 1 tablet (450 mg total) by mouth at bedtime. 30 tablet 2   No current facility-administered medications for this visit.     Musculoskeletal: Strength & Muscle Tone: within normal limits Gait & Station: normal Patient leans: N/A  Psychiatric Specialty Exam: Review of Systems  unknown if currently breastfeeding.There is no height or weight on file to calculate BMI.  General Appearance: Well Groomed  Eye Contact:  Good  Speech:  Clear and Coherent and Normal Rate  Volume:  Normal  Mood:  Depressed  Affect:  Congruent  Thought Process:  Coherent, Goal Directed and Linear  Orientation:  Full (Time, Place, and Person)  Thought Content: WDL and Logical   Suicidal Thoughts:  No  Homicidal Thoughts:  No  Memory:  Immediate;   Good Recent;   Good Remote;   Good  Judgement:  Good  Insight:  Good  Psychomotor Activity:  Normal  Concentration:  Concentration: Good and Attention Span: Good  Recall:  Good  Fund of Knowledge: Good  Language: Good  Akathisia:  No   Handed:  Right  AIMS (if indicated): Not done  Assets:  Communication Skills Desire for Improvement Financial Resources/Insurance Housing Social Support  ADL's:  Intact  Cognition: WNL  Sleep:  Good   Screenings: CAGE-AID     Counselor from 06/13/2019 in [facility]  CAGE-AID Score 0    GAD-7     Counselor from 06/13/2019 in [facility]  Total GAD-7 Score 2    Mini-Mental     Office Visit from 04/21/2018 in [facility]  Total Score (max 30 points ) 28    PHQ2-9     Counselor from 06/13/2019 in [facility]  PHQ-2 Total Score 2       Assessment and Plan: Patient endorses symptoms of depression.  She is agreeable to taking lithium 450  mg as prescribed, and increase Vraylar 1.5 mg to 3 mg to help manage her mood. .  1. Bipolar I disorder with depression (HCC)  Increased- cariprazine (VRAYLAR) capsule; Take 1 capsule (3 mg total) by mouth daily.  Dispense: 30 capsule; Refill: 2 continue- lithium carbonate (ESKALITH) 450 MG CR tablet; Take 1 tablet (450 mg total) by mouth at bedtime.  Dispense: 30 tablet; Refill: 2 - Lithium level   Follow-up in 2 months Shanna Cisco, NP 08/10/2019, 4:34 PM

## 2019-08-11 MED FILL — VRAYLAR 3 MG CAPSULE: 3 | 30 days supply | Qty: 30 | Fill #0

## 2019-08-11 NOTE — Progress Notes (Signed)
   THERAPIST PROGRESS NOTE  Session Time: 57  Therapist Response:     Subjective/Objective:  Lauren Duke was alert and oriented x 5. She was dressed casually but fairly groomed. Pt presents with flat depressed mood/affect but was well engaged.   Lauren Duke reports primary stressor due to legal cases that are pending. She has 14 charges that are pending for 8/5 which she will present her case for with public defender. Pt is unsure what the outcomes will be. LCSW used solution focused therapy as pt also reports secondary stressors and family, relationship, and housing. This will help pt prioritize her current stressor and break it down into more manageable problems.  She is currently going to be confronted by her ex-spouse in her current court cases as some charges will need his testimony. Lauren Duke knows that he will be present but also wants to keep a civil relationship with her ex-spouse because they still have two small children which he has full custody of. She reports that she is taking full responsibility for her actions even though her manic episode primary force behind her action. Pt does state that she understands why her ex-spouse will testify against her.  Pt is currently living in her mother's home after breaking up with her ex-boyfriend. She reports "I am 34 year old women living in my mother home". This has made the pt more depressed. Within the last month pt has broke up with her ex-boyfriend "Franky Macho" as she reports she was unhappy in the relationship and the direction her life is currently going. Lauren Duke is prioritizing legal, children/family, and housing over her personal relationships currently.    Assessment/plan: Pt endorse symptoms for depression, sadness, fatigue, over eating, over sleeping and guilt. Also is having symptoms of anxiety with worry & tension.  She currently meets criteria for bipolar 1 currently/recent depressed. Plan moving forward take medication as prescribed, journal  activities pt has interest in, and move final items out of her ex-spouses home. [name] will follow up with LCSW in 2 weeks pending her legal charges.    Participation Level: Active  Behavioral Response: Casual and Fairly GroomedAlertDepressed  Type of Therapy: Individual Therapy  Treatment Goals addressed: Coping and Diagnosis: bipolar disorder   Interventions: CBT and Solution Focused  Summary: Lauren Duke is a 34 y.o. female who presents with bipolar disorder   Suicidal/Homicidal: NAwithout intent/plan    Plan: Return again in 2 weeks.  Diagnosis: Axis I: Bipolar, Depressed    [name], LCSW 08/11/2019

## 2019-08-30 ENCOUNTER — Other Ambulatory Visit: Payer: Self-pay

## 2019-08-30 ENCOUNTER — Ambulatory Visit (INDEPENDENT_AMBULATORY_CARE_PROVIDER_SITE_OTHER): Payer: No Payment, Other | Admitting: Licensed Clinical Social Worker

## 2019-08-30 DIAGNOSIS — F319 Bipolar disorder, unspecified: Secondary | ICD-10-CM | POA: Diagnosis not present

## 2019-08-30 NOTE — Progress Notes (Signed)
   THERAPIST PROGRESS NOTE  Session Time: 40  Virtual Visit via Video Note  I connected with Lauren Duke on 08/30/19 at  2:00 PM EDT by a video enabled telemedicine application and verified that I am speaking with the correct person using two identifiers.  Location: Patient: [facility] Provider: [facility]    I discussed the limitations of evaluation and management by telemedicine and the availability of in person appointments. The patient expressed understanding and agreed to proceed.  Subjective/Objective: Pt was alert and oriented x 5. She was dressed casually and well engaged throughout session. She presents with good eye contact and had a flat/depressed mood/affect. Lauren Duke comes in today relieved that one court case for her misdemeanors is now resolved. She received 1 year of supervised probation and needs to take a 26-week domestic violence course. Pt can now move forward to her felony charges, but a court date has not yet been determined for that. Currently pt is living with her mother and her spouse. Pt broke up with her significant other over 1 month ago. She still has a intimate relationship with However does not consider them to be dating at this time. She wants to keep the few social interactions with people she does have in her life progressing. Pt reports that she was supposed to write down 3 contact to her probation officer and realized the only people she still has contact with are her Ex-boyfriend, ex-husband, and her mother. Pt goal moving forward will be to increase social interactions once legal situation can be settled.    Assessment/plan: Pt endorses depressive symptoms for sadness, worthlessness, irritability, isolation, and hopeless. Pt currently meets criteria for bipolar 1 currently depressive. She has been taking her medication as prescribe, but depressive symptoms have increased over the last several weeks. Lauren Duke does denied any SI/HI or Hallucinations at  this time. Plan moving forward obtain a court date for felony charges, apply for 2 jobs weekly, follow medication regiment. Pt will follow up with LCSW in two weeks.    I discussed the assessment and treatment plan with the patient. The patient was provided an opportunity to ask questions and all were answered. The patient agreed with the plan and demonstrated an understanding of the instructions.   The patient was advised to call back or seek an in-person evaluation if the symptoms worsen or if the condition fails to improve as anticipated.  I provided 50 minutes of non-face-to-face time during this encounter.   [name], LCSW    Participation Level: Active  Behavioral Response: Casual, Neat and Well GroomedAlertDepressed  Type of Therapy: Individual Therapy  Treatment Goals addressed: Diagnosis: Bipolar disorder  Interventions: CBT, Supportive and Reframing  Summary: Lauren Duke is a 34 y.o. female who presents with bipolar 1 disorder .   Suicidal/Homicidal: Nowithout intent/plan   Plan: Return again in 2 weeks.    [name], LCSW 08/30/2019

## 2019-09-14 ENCOUNTER — Ambulatory Visit (INDEPENDENT_AMBULATORY_CARE_PROVIDER_SITE_OTHER): Payer: No Payment, Other | Admitting: Licensed Clinical Social Worker

## 2019-09-14 ENCOUNTER — Other Ambulatory Visit: Payer: Self-pay

## 2019-09-14 DIAGNOSIS — F319 Bipolar disorder, unspecified: Secondary | ICD-10-CM | POA: Diagnosis not present

## 2019-09-14 NOTE — Progress Notes (Signed)
THERAPIST PROGRESS NOTE  Virtual Visit via Video Note  I connected with Lauren Duke on 09/14/19 at  2:00 PM EDT by a video enabled telemedicine application and verified that I am speaking with the correct person using two identifiers.  Location: Patient: [facility]  Provider: [facility]    I discussed the limitations of evaluation and management by telemedicine and the availability of in person appointments. The patient expressed understanding and agreed to proceed.   Therapist Response:     Subjective/Objective:  Pt was alert and oriented x 5. She was dressed casually and engaged well throughout session as evidence by note below. Lauren Duke presented today with flat & depressed mood/affect.   Pt came in explain a new development in her life. 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 She did report that she disclosed her criminal background to her employer. Pt will be starting within the next three weeks. Pt does have some concerns about maintaining annual therapy sessions along with the multiple month group she is required to do from the court. LCSW presented with the options to continue sessions virtually and would be willing to see pt on her lunch. Lauren Duke will investigate her daily hours and work with therapist to try and maintain her sessions.    Pt reports that she has been seeing her children 1 x per week. She also was able to babysit over night the kids while her ex-spouse was on an off-duty assignment (Ex-spouse is a Emergency planning/management officer). Lauren Duke states that her relationship has been improving every week with her children and she has kept things "civil" with her ex-spouse. Pt ex-boyfriend also has been a consistent support for pt. She goes over to his house 1 x weekly. Pt reports that these are the only support other than her mother. This is because her other personal friendships were wrecked when pt was going into her manic phase.     Assessment/Plan: Pt  endorses symptoms for depression for sadness, irritability, worthlessness and hopelessness. Pt does meet criteria for bipolar currently depressive. She has been taking all medication as prescribed. Pt has had a decrease in quality of sleep. And reports 4 to 6 hours of nightly sleep. Plan moving forward. Pt to write down activities she wants to accomplish, goal is to maintain at least two activities weekly for hobby purposes. She will also start to walk 1 x weekly and take all medications as prescribed.     I discussed the assessment and treatment plan with the patient. The patient was provided an opportunity to ask questions and all were answered. The patient agreed with the plan and demonstrated an understanding of the instructions.   The patient was advised to call back or seek an in-person evaluation if the symptoms worsen or if the condition fails to improve as anticipated.  I provided 45 minutes of non-face-to-face time during this encounter.   [name], LCSW   Participation Level: Active  Behavioral Response: CasualAlertDepressed  Type of Therapy: Individual Therapy  Treatment Goals addressed: Diagnosis: bipolar depression   Interventions: CBT, Motivational Interviewing and Supportive  Summary: Lauren Duke is a 34 y.o. female who presents with bipolar depression.   Suicidal/Homicidal: Nowithout intent/plan   Plan: Return again in 2 weeks.   [name], LCSW 09/14/2019

## 2019-09-20 MED FILL — VRAYLAR 3 MG CAPSULE: 3 | 30 days supply | Qty: 30 | Fill #1

## 2019-09-28 ENCOUNTER — Other Ambulatory Visit: Payer: Self-pay

## 2019-09-28 ENCOUNTER — Ambulatory Visit (INDEPENDENT_AMBULATORY_CARE_PROVIDER_SITE_OTHER): Payer: No Payment, Other | Admitting: Licensed Clinical Social Worker

## 2019-09-28 DIAGNOSIS — F319 Bipolar disorder, unspecified: Secondary | ICD-10-CM

## 2019-09-28 NOTE — Progress Notes (Signed)
   THERAPIST PROGRESS NOTE  Virtual Visit via Video Note  I connected with Lauren Duke on 09/28/19 at  2:00 PM EDT by a video enabled telemedicine application and verified that I am speaking with the correct person using two identifiers.  Location: Patient: Lauren Duke  Provider: [facility]   I discussed the limitations of evaluation and management by telemedicine and the availability of in person appointments. The patient expressed understanding and agreed to proceed.   Therapist Response:     Subjective/objective: Pt was alert and oriented x 5. She was dressed neat and clean as evidence by new haircut, and business casual attire. Pt still presented today with depressed/flat mood and affect   Pt report that she has recently started a new job 5 days ago. Although this is good financially for her, she does not enjoy the job overall. She is bored and does not enjoy the people she works with. Despite these statement pt still report that she has been driving to work 40 minutes from where she is currently living. She states "Everyone has to start somewhere"   Pt also reports that she has officially ended all contact with her ex-boyfriend Lauren Duke. Per pt he was still holding on hope to getting back together with pt. She has noticed a decline in his mental health since they broke up. Lauren Duke feel that with his decline in mood/affect and lack of taking his medications this is not a healthy relationship for her to continue. Pt wants to focus on her kids and states "I really should not have a boyfriend right now". Lauren Duke states that only real motivation that she has is her children and Lauren Duke her children's father has been giving her more responsibility with the kids.   Assessment/plan: Pt endorses symptoms for depression for sadness, irritability, worthlessness, and hopelessness. She currently meets criteria for bipolar 1 disorder currently depressive. She reports that she is taking all  medication as directed. Plan moving forward plan a weekend trip with children to help motivate pt for further social interactions.    I discussed the assessment and treatment plan with the patient. The patient was provided an opportunity to ask questions and all were answered. The patient agreed with the plan and demonstrated an understanding of the instructions.   The patient was advised to call back or seek an in-person evaluation if the symptoms worsen or if the condition fails to improve as anticipated.  I provided 40 minutes of non-face-to-face time during this encounter.   Lauren Cooks, LCSW   Participation Level: Active  Behavioral Response: Casual, Neat and Well GroomedAlertDepressed  Type of Therapy: Individual Therapy  Treatment Goals addressed: Diagnosis: bipolar disorder depressed   Interventions: Supportive  Summary: Lauren Duke is a 34 y.o. female who presents with bipolar disorder.   Suicidal/Homicidal: Nowithout intent/plan   Plan: Return again in 2 weeks.    Lauren Cooks, LCSW 09/28/2019

## 2019-10-11 ENCOUNTER — Ambulatory Visit (INDEPENDENT_AMBULATORY_CARE_PROVIDER_SITE_OTHER): Payer: No Payment, Other | Admitting: Licensed Clinical Social Worker

## 2019-10-11 ENCOUNTER — Other Ambulatory Visit: Payer: Self-pay

## 2019-10-11 DIAGNOSIS — F319 Bipolar disorder, unspecified: Secondary | ICD-10-CM

## 2019-10-11 NOTE — Progress Notes (Signed)
   THERAPIST PROGRESS NOTE  Virtual Visit via Video Note  I connected with Lauren Duke on 10/11/19 at  2:00 PM EDT by a video enabled telemedicine application and verified that I am speaking with the correct person using two identifiers.  Location: [place]  Provider: [facility]   I discussed the limitations of evaluation and management by telemedicine and the availability of in person appointments. The patient expressed understanding and agreed to proceed.  Therapist Response:    Subjective/Objective: Pt was alert and oriented x 5. 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 She presented today with euthymic mood/affect with good engagement as evidence by note below. Lauren Duke had good eye contact and was cooperative.   Pt reports primary stressor continues to be relationship and family conflict. Currently she is in the middle of a divorce with his current husband and the father to her two children. She is trying to maintain a good/healthy relationship with him. This has been a stressor on pt as he has stated on multiple occasions that trust may not be able to be established on a personal level due to prior choices by pt. Examples include cheating and other reckless behavior. Lauren Duke primary focus is on her children and attempting to build a relationship back with them.   Other stress does include pt children. She has been taking on more responsibility with them under the supervision of her soon to be ex-spouse. This has been going steadily increasing for the past 8 weeks since [place] broke up with her now ex-boyfriend Lauren Duke. Pt has conducted activities with the children such as sporting events, taking kids to birthday parties, and recreational activities.   Lauren Duke has also been maintaining a job for the past two + weeks. She does not enjoy it as she feels it is repetitive and "Mindless" work. Pt does enjoy getting an income again but wants to have more  responsibility like at her old job.    Assessment/plan: Pt endorse symptoms for anxiety and depression for tension, worry, sadness, hopelessness, and irritability. She does meet criteria for bipolar disorder depression. She continues to take medications as prescribed. Plan moving forward is to plan a vacation with the children to continue to build their relationship back up.    I discussed the assessment and treatment plan with the patient. The patient was provided an opportunity to ask questions and all were answered. The patient agreed with the plan and demonstrated an understanding of the instructions.   The patient was advised to call back or seek an in-person evaluation if the symptoms worsen or if the condition fails to improve as anticipated.  I provided 40 minutes of non-face-to-face time during this encounter.   [name], LCSW  Participation Level: Active  Behavioral Response: CasualAlertEuthymic  Type of Therapy: Individual Therapy  Treatment Goals addressed: Diagnosis: bipolar disorder   Interventions: CBT and Supportive  Summary: Lauren Duke is a 34 y.o. female who presents with bipolar disorder.   Suicidal/Homicidal: Nowithout intent/plan   Plan: Return again in 2 weeks.    [name], LCSW 10/11/2019

## 2019-10-12 ENCOUNTER — Encounter (HOSPITAL_COMMUNITY): Payer: Self-pay | Admitting: Psychiatry

## 2019-10-12 ENCOUNTER — Ambulatory Visit (INDEPENDENT_AMBULATORY_CARE_PROVIDER_SITE_OTHER): Payer: No Payment, Other | Admitting: Psychiatry

## 2019-10-12 ENCOUNTER — Other Ambulatory Visit: Payer: Self-pay

## 2019-10-12 ENCOUNTER — Other Ambulatory Visit (HOSPITAL_COMMUNITY): Payer: Self-pay | Admitting: Psychiatry

## 2019-10-12 DIAGNOSIS — F319 Bipolar disorder, unspecified: Secondary | ICD-10-CM

## 2019-10-12 MED ORDER — LITHIUM CARBONATE ER 450 MG PO TBCR: 450.0000 mg | EXTENDED_RELEASE_TABLET | Freq: Every day | ORAL | 2 refills | Status: DC

## 2019-10-12 MED FILL — LITHIUM ER 450 MG TABLET: 450 | 30 days supply | Qty: 30 | Fill #0

## 2019-10-12 NOTE — Progress Notes (Signed)
BH MD/PA/NP OP Progress Note  10/12/2019 3:55 PM Lauren Duke  MRN:  [id number]  Chief Complaint:  Chief Complaint    Medication Management    " I feel blah."    HPI: Lauren Duke is a 34 year old female who presents for follow-up psychiatric evaluation. She has a history of adjustment disorder, anxiety, bipolar 1, and depression and is managed on Lithium 450mg  daily and Vraylar 3mg  daily.   Patient states she is feeling "blah" and is not sure if the Lithium is working for her.  Provider encouraged patient to reflect on her life prior to being on medications.  She notes that prior to being on medications she was in a manic state which caused her to do things that were illegal.  She reports that she went to court resently and was convicted of 5 charges and still has pending felony charges.  She also states that she finds no enjoyment in doing things and just wants to sleep all the time, but her mood is stable. She did recently get a job and cites that as an improvement in her life. She also cites her ability to see her children as another positive. She has been taking her lithium as prescribed but states she does not understand why she needs to take it, because she is still not feeling happy. She also endorses poor concentration, anhedonia, anxiety, and fatigue. She rates her anxiety a 6/10 and depression a 5-6/10 [with 10 being the most severe]. She denies SI/HI/AVH or paranoia and she sleeps 8 hours/night.     Patient states she is not looking for a new medication to fix her and that her current medications are causing her to feel worse.  Provider recommended either switching her Vraylar to Abilify or adding an SSRI to her current regimen.  She notes that she did not want to start and an SSRI to help improve her depressive symptoms because it induced mania in the past when she was on .   She also reports that she does not want to start Abilify or any other medications at this time. After discussing  further with the patient, she wishes to continue her current medications and follow-up in 3 months for outpatient psychiatric follow-up. She has follow-up scheduled with therapy as well. No other questions or concerns noted at this time. Visit Diagnosis:    ICD-10-CM   1. Bipolar I disorder with depression (HCC)  F31.9 lithium carbonate (ESKALITH) 450 MG CR tablet    Past Psychiatric History: Anxiety, Depression, Bipolar disorder  Past Medical History:  Past Medical History:  Diagnosis Date  . Depression   . Memory loss   . Word finding difficulty    No past surgical history on file.  Family Psychiatric History: Unknown  Family History:  Family History  Problem Relation Age of Onset  . Healthy Mother   . Healthy Father     Social History:  Social History   Socioeconomic History  . Marital status: Divorced    Spouse name: Not on file  . Number of children: 2  . Years of education: college  . Highest education level: Not on file  Occupational History  . Not on file  Tobacco Use  . Smoking status: Never Smoker  . Smokeless tobacco: Never Used  Substance and Sexual Activity  . Alcohol use: Not Currently    Alcohol/week: 0.0 standard drinks  . Drug use: Never  . Sexual activity: Not on file  Other Topics Concern  .  Not on file  Social History Narrative   Lives at home with husband and two children.   Right-handed.   No caffeine use.   Social Determinants of Health   Financial Resource Strain:   . Difficulty of Paying Living Expenses: Not on file  Food Insecurity:   . Worried About Programme researcher, broadcasting/film/video in the Last Year: Not on file  . Ran Out of Food in the Last Year: Not on file  Transportation Needs:   . Lack of Transportation (Medical): Not on file  . Lack of Transportation (Non-Medical): Not on file  Physical Activity:   . Days of Exercise per Week: Not on file  . Minutes of Exercise per Session: Not on file  Stress:   . Feeling of Stress : Not on file   Social Connections:   . Frequency of Communication with Friends and Family: Not on file  . Frequency of Social Gatherings with Friends and Family: Not on file  . Attends Religious Services: Not on file  . Active Member of Clubs or Organizations: Not on file  . Attends Banker Meetings: Not on file  . Marital Status: Not on file    Allergies: No Known Allergies  Metabolic Disorder Labs: No results found for: HGBA1C, MPG No results found for: PROLACTIN No results found for: CHOL, TRIG, HDL, CHOLHDL, VLDL, LDLCALC No results found for: TSH  Therapeutic Level Labs: Lab Results  Component Value Date   LITHIUM <0.1 (L) 07/22/2019   Lab Results  Component Value Date   VALPROATE 47 (L) 03/08/2019   VALPROATE 63 03/08/2019   No components found for:  CBMZ  Current Medications: Current Outpatient Medications  Medication Sig Dispense Refill  . cariprazine (VRAYLAR) capsule Take 1 capsule (3 mg total) by mouth daily. 30 capsule 2  . lithium carbonate (ESKALITH) 450 MG CR tablet Take 1 tablet (450 mg total) by mouth at bedtime. 30 tablet 2   No current facility-administered medications for this visit.     Musculoskeletal: Strength & Muscle Tone: within normal limits Gait & Station: normal Patient leans: N/A  Psychiatric Specialty Exam: Review of Systems  Blood pressure 105/77, pulse 79, height 5\' 5"  (1.651 m), weight 134 lb (60.8 kg), SpO2 100 %, unknown if currently breastfeeding.Body mass index is 22.3 kg/m.  General Appearance: Well Groomed  Eye Contact:  Good  Speech:  Clear and Coherent and Normal Rate  Volume:  Normal  Mood:  Depressed  Affect:  Congruent  Thought Process:  Coherent, Goal Directed and Linear  Orientation:  Full (Time, Place, and Person)  Thought Content: WDL and Logical   Suicidal Thoughts:  No  Homicidal Thoughts:  No  Memory:  Immediate;   Good Recent;   Good Remote;   Good  Judgement:  Good  Insight:  Good  Psychomotor  Activity:  Normal  Concentration:  Concentration: Good and Attention Span: Good  Recall:  Good  Fund of Knowledge: Good  Language: Good  Akathisia:  No  Handed:  Right  AIMS (if indicated): Not done  Assets:  Communication Skills Desire for Improvement Financial Resources/Insurance Housing Social Support  ADL's:  Intact  Cognition: WNL  Sleep:  Good   Screenings: CAGE-AID     Counselor from 06/13/2019 in [facility]  CAGE-AID Score 0    GAD-7     Counselor from 06/13/2019 in [facility]  Total GAD-7 Score 2    Mini-Mental  Office Visit from 04/21/2018 in [facility]  Total Score (max 30 points ) 28    PHQ2-9     Counselor from 06/13/2019 in [facility]  PHQ-2 Total Score 2       Assessment and Plan: Patient endorses symptoms of depression.  Provider recommended starting an SSRI to help manage her depressive symptoms however patient notes that she fears taking any antidepressants because they induced a manic episode.  Provider also suggested switching Vraylar 3 mg to Abilify however patient notes that she did not want to try this at this time.  She is agreeable to continue all medications as prescribed.  She notes that she will get her lithium's level checked in the near future.  Patient notes that she just received a 68-month supply of Vraylar and is not in need of refills at this time.  1. Bipolar I disorder with depression (HCC)  Continue- cariprazine (VRAYLAR) capsule; Take 1 capsule (3 mg total) by mouth daily.  Dispense: 30 capsule; Refill: 2 continue- lithium carbonate (ESKALITH) 450 MG CR tablet; Take 1 tablet (450 mg total) by mouth at bedtime.  Dispense: 30 tablet; Refill: 2 - Lithium level   Follow-up in 3 months Follow-up with therapy Shanna Cisco, NP 10/12/2019, 3:55 PM

## 2019-10-25 ENCOUNTER — Ambulatory Visit (HOSPITAL_COMMUNITY): Payer: No Payment, Other | Admitting: Licensed Clinical Social Worker

## 2019-10-25 ENCOUNTER — Other Ambulatory Visit: Payer: Self-pay

## 2019-10-25 DIAGNOSIS — F3162 Bipolar disorder, current episode mixed, moderate: Secondary | ICD-10-CM

## 2019-10-25 NOTE — Progress Notes (Signed)
   THERAPIST PROGRESS NOTE  Virtual Visit via Video Note  I connected with Lauren Duke on 10/25/19 at  2:00 PM EDT by a video enabled telemedicine application and verified that I am speaking with the correct person using two identifiers.  Location: Patient: [facility]  Provider: [facility]   I discussed the limitations of evaluation and management by telemedicine and the availability of in person appointments. The patient expressed understanding and agreed to proceed.  Therapist Response:    Subjective/Objective: Pt was alert and oriented x 5. She was dressed casually and was engaged in therapy session. Pt presented today with euthymic mood/affect. She was cooperative and maintained good eye contact.   Pt reports primary stressors as legal. She reports that she pays $15.00 per class for the next 6 months. The class is for Domestic Violence intervention. Lauren Duke states that this has been a financial burden on her but has still managed to maintain all her classes. She has had an improved attitude at work. This is due to more social interactions and obtaining a stable paycheck. Her relationships are still not where she wants them to be. She still has managed to keep a relationship with her husband who she is separated from for the benefit of the kids. Lauren Duke pt ex-boyfriend she still has contact with but nothing more than friends.   Lauren Duke reports that she has been able to see the kids on the weekends and at clubs/sporting events they participate in. Pt mother recently got free tickets on [employer] and pt would like to take a trip. She does have to clear this with her probation officer first and pt plans to discuss this at her next appointment.   Assessment/Plan: Pt endorse symptoms for irritability, lack of concentration, tension, worry, and restlessness. Currently pt does meet criteria for bipolar 1 currently mixed. Lauren Duke has been taking all medications as prescribed.  Plan moving forward: Maintain work attendance, plan trip if approved by probation officer, and continue to maintain relationship with Lauren Duke (Separated spouse) for the benefit of the children.     I discussed the assessment and treatment plan with the patient. The patient was provided an opportunity to ask questions and all were answered. The patient agreed with the plan and demonstrated an understanding of the instructions.   The patient was advised to call back or seek an in-person evaluation if the symptoms worsen or if the condition fails to improve as anticipated.  I provided 35 minutes of non-face-to-face time during this encounter.   [name], LCSW  Participation Level: Active  Behavioral Response: Neat and Well GroomedAlertEuthymic  Type of Therapy: Individual Therapy  Treatment Goals addressed: Diagnosis: bipolar mixed   Interventions: CBT and Supportive  Summary: Lauren Duke is a 34 y.o. female who presents with bipolar currently mixed.   Suicidal/Homicidal: Nowithout intent/plan   Plan: Return again in 4 weeks.      [name], LCSW 10/25/2019

## 2019-11-18 ENCOUNTER — Other Ambulatory Visit (HOSPITAL_COMMUNITY): Payer: Self-pay | Admitting: Psychiatry

## 2019-11-18 DIAGNOSIS — F319 Bipolar disorder, unspecified: Secondary | ICD-10-CM

## 2019-11-18 MED FILL — LITHIUM ER 450 MG TABLET: 450 | 60 days supply | Qty: 60 | Fill #1

## 2019-11-21 ENCOUNTER — Ambulatory Visit (INDEPENDENT_AMBULATORY_CARE_PROVIDER_SITE_OTHER): Payer: No Payment, Other | Admitting: Licensed Clinical Social Worker

## 2019-11-21 ENCOUNTER — Other Ambulatory Visit: Payer: Self-pay

## 2019-11-21 DIAGNOSIS — F3175 Bipolar disorder, in partial remission, most recent episode depressed: Secondary | ICD-10-CM | POA: Diagnosis not present

## 2019-11-21 DIAGNOSIS — F411 Generalized anxiety disorder: Secondary | ICD-10-CM

## 2019-11-21 NOTE — Progress Notes (Signed)
   THERAPIST PROGRESS NOTE Virtual Visit via Video Note  I connected with Lauren Duke on 11/21/19 at  4:00 PM EST by a video enabled telemedicine application and verified that I am speaking with the correct person using two identifiers.  Location: Patient: Home  Provider: [facility]    I discussed the limitations of evaluation and management by telemedicine and the availability of in person appointments. The patient expressed understanding and agreed to proceed.   Session Time: 30  Therapist Response:   Subjective/Objective:  Pt was alert and oriented x 5. 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 Pt presented today with a euthymic mood/affect. She engaged well throughout assessment. Pt was cooperative and maintained good eye contact   Pt primary stressor continue to be legal. She is still pending many felonies from 1 year ago. Currently the court is trying to find a new prosecutor as the old one retires. This has caused pt stress, tension, and worry. The court keeps delaying and her new job would like more clarity as to what punishment she could receive. LCSW used CBT therapy as primary intervention for this stressor. Pt and LCSW engaged in speaking about the multiple goods things in her life replacing the negative thoughts with more positive ones. Lauren Duke stated this provided her with relief.    Assessment/Plan: Pt endorse symptoms for tension, worry, insomnia, and hopelessness. She meets criteria for bipolar 1 in partial remission and GAD. Lauren Duke continues to be compliant with her medications. Plan moving forward is to keep progressing in her personal life while she meets the terms of her probation. Lauren Duke was agreeable to this plan and will continue to meet with LCSW 1 x monthly.    I discussed the assessment and treatment plan with the patient. The patient was provided an opportunity to ask questions and all were answered. The patient agreed with the plan and  demonstrated an understanding of the instructions.   The patient was advised to call back or seek an in-person evaluation if the symptoms worsen or if the condition fails to improve as anticipated.  I provided 30 minutes of non-face-to-face time during this encounter.   [name], LCSW   Participation Level: Active  Behavioral Response: Casual, Neat and Well GroomedAlertEuthymic  Type of Therapy: Individual Therapy  Treatment Goals addressed: Diagnosis: bipolar 1 disorder   Interventions: Supportive  Summary: Lauren Duke is a 34 y.o. female who presents with bipolar 1.   Suicidal/Homicidal: Nowithout intent/plan   Plan: Return again in 4 weeks.      [name], LCSW 11/21/2019

## 2019-12-20 ENCOUNTER — Other Ambulatory Visit: Payer: Self-pay

## 2019-12-20 ENCOUNTER — Ambulatory Visit (INDEPENDENT_AMBULATORY_CARE_PROVIDER_SITE_OTHER): Payer: No Payment, Other | Admitting: Licensed Clinical Social Worker

## 2019-12-20 DIAGNOSIS — F319 Bipolar disorder, unspecified: Secondary | ICD-10-CM

## 2019-12-20 NOTE — Progress Notes (Signed)
   THERAPIST PROGRESS NOTE Virtual Visit via Telephone Note  I connected with Lauren Duke on 12/20/19 at  3:00 PM EST by telephone and verified that I am speaking with the correct person using two identifiers.  Location: Patient: [name]  Provider: [facility]    I discussed the limitations, risks, security and privacy concerns of performing an evaluation and management service by telephone and the availability of in person appointments. I also discussed with the patient that there may be a patient responsible charge related to this service. The patient expressed understanding and agreed to proceed.   Session Time: 31  Therapist Response:    Subjective/Objective: Pt was alert and oriented x 5. She was not observed due to technical difficulties with video chat. Pt presented today with euthymic mood/affect.   Primary stressor today is family conflict and legal. Pt has now taken care of all her misdemeanor charges, but still has felony charges pending. Her lawyer would like to speak to her about the felony charges pending as of right now no court date has been set. This has caused Lauren Duke stress and tension.   Pt has also been dealing with family conflict. She has been reconciling her relationship with her ex-spouse Lauren Duke. She has two children with him, and the eldest child has become anxious about the relationship. Lauren Duke and Lauren Duke have been handling the situation with support for their daughter as they understand this is a transitioning for her as well.   LCSW used supportive therapy to help pt through her stressors. She reported relief from the intervention.    Assessment/Plan: Pt endorse symptoms for tensions and worry along with feelings of irritability currently pt does meet criteria for bipolar disorder in partial remission. Plan moving forward to continue to offer pt supportive therapy and she transition to every 47-month session.     I discussed the assessment  and treatment plan with the patient. The patient was provided an opportunity to ask questions and all were answered. The patient agreed with the plan and demonstrated an understanding of the instructions.   The patient was advised to call back or seek an in-person evaluation if the symptoms worsen or if the condition fails to improve as anticipated.  I provided 30 minutes of non-face-to-face time during this encounter.   [name], LCSW   Participation Level: Active  Behavioral Response: NAAlertEuthymic  Type of Therapy: Individual Therapy  Treatment Goals addressed: Diagnosis: bipolar depression   Interventions: Supportive  Summary: Lauren Duke is a 34 y.o. female who presents with bipolar disorder depression.   Suicidal/Homicidal: NAwithout intent/plan  Plan: Return again in 4 weeks.     [name], LCSW 12/20/2019

## 2019-12-27 MED FILL — LITHIUM ER 450 MG TABLET: 450 | 60 days supply | Qty: 60 | Fill #1

## 2020-01-13 ENCOUNTER — Telehealth (INDEPENDENT_AMBULATORY_CARE_PROVIDER_SITE_OTHER): Payer: No Payment, Other | Admitting: Psychiatry

## 2020-01-13 ENCOUNTER — Encounter (HOSPITAL_COMMUNITY): Payer: Self-pay | Admitting: Psychiatry

## 2020-01-13 ENCOUNTER — Other Ambulatory Visit: Payer: Self-pay

## 2020-01-13 DIAGNOSIS — F319 Bipolar disorder, unspecified: Secondary | ICD-10-CM

## 2020-01-13 MED ORDER — CARIPRAZINE HCL 3 MG PO CAPS: 3.0000 mg | ORAL_CAPSULE | Freq: Every day | ORAL | 2 refills | Status: AC

## 2020-01-13 MED ORDER — LITHIUM CARBONATE ER 450 MG PO TBCR: 450.0000 mg | EXTENDED_RELEASE_TABLET | Freq: Every day | ORAL | 2 refills | Status: AC

## 2020-01-13 NOTE — Progress Notes (Signed)
BH MD/PA/NP OP Progress Note Virtual Visit via Video Note  I connected with Lauren Duke on 01/13/20 at  8:30 AM EST by a video enabled telemedicine application and verified that I am speaking with the correct person using two identifiers.  Location: Patient: Home Provider: Clinic   I discussed the limitations of evaluation and management by telemedicine and the availability of in person appointments. The patient expressed understanding and agreed to proceed.  I provided 30 minutes of non-face-to-face time during this encounter.    01/13/2020 8:52 AM Lauren Duke  MRN:  [id number]  Chief Complaint: "I feel better but the same"   HPI:  35 year old female who presents for follow-up psychiatric evaluation. She has a history of adjustment disorder, anxiety, bipolar 1, and depression and is managed on Lithium 450mg  daily and Vraylar 3mg  daily.   Today patient is pleasant, calm, cooperative, and engaged in conversation. During exam she states that she is feeling better but the same. Provider asked patient if anything had changed since her last visit and she notes that they had not. She informed Probation officer that she still live with her mother and notes that she still have 5 pending charges and a pending felony. She notes that she has very minimal anxiety and depression. Provider conducted a GAD 7 and patient scored a 2. Provider also conducted a PHQ 9 and patient scored a 3. She denies SI/HI/VAH or paranoia. She endorses adequate sleep and appetite. She notes her mood is stable and denies symptoms of mania.   Patient notes that she take her medications every other day because she has problems getting to the pharmacy. Provider recommended that patient take medications everyday. She requested that provider give her a 3 month supply of the medication and notes that she would take them daily. Provider agreed. No medication changes made. Patient agreeable to take medications as prescribed. She has  follow-up scheduled with therapy as well. No other questions or concerns noted at this time. Visit Diagnosis:    ICD-10-CM   1. Bipolar I disorder with depression ([place])  F31.9 cariprazine (VRAYLAR) capsule    lithium carbonate (ESKALITH) 450 MG CR tablet    Past Psychiatric History: Anxiety, Depression, Bipolar disorder  Past Medical History:  Past Medical History:  Diagnosis Date  . Depression   . Memory loss   . Word finding difficulty    No past surgical history on file.  Family Psychiatric History: Unknown  Family History:  Family History  Problem Relation Age of Onset  . Healthy Mother   . Healthy Father     Social History:  Social History   Socioeconomic History  . Marital status: Divorced    Spouse name: Not on file  . Number of children: 2  . Years of education: college  . Highest education level: Not on file  Occupational History  . Not on file  Tobacco Use  . Smoking status: Never Smoker  . Smokeless tobacco: Never Used  Substance and Sexual Activity  . Alcohol use: Not Currently    Alcohol/week: 0.0 standard drinks  . Drug use: Never  . Sexual activity: Not on file  Other Topics Concern  . Not on file  Social History Narrative   Lives at home with husband and two children.   Right-handed.   No caffeine use.   Social Determinants of Health   Financial Resource Strain: Not on file  Food Insecurity: Not on file  Transportation Needs: Not on file  Physical Activity:  Not on file  Stress: Not on file  Social Connections: Not on file    Allergies: No Known Allergies  Metabolic Disorder Labs: No results found for: HGBA1C, MPG No results found for: PROLACTIN No results found for: CHOL, TRIG, HDL, CHOLHDL, VLDL, LDLCALC No results found for: TSH  Therapeutic Level Labs: Lab Results  Component Value Date   LITHIUM <0.1 (L) 07/22/2019   Lab Results  Component Value Date   VALPROATE 47 (L) 03/08/2019   VALPROATE 63 03/08/2019   No  components found for:  CBMZ  Current Medications: Current Outpatient Medications  Medication Sig Dispense Refill  . cariprazine (VRAYLAR) capsule Take 1 capsule (3 mg total) by mouth daily. 90 capsule 2  . lithium carbonate (ESKALITH) 450 MG CR tablet Take 1 tablet (450 mg total) by mouth at bedtime. 90 tablet 2   No current facility-administered medications for this visit.     Musculoskeletal: Strength & Muscle Tone: Unable to assess due to telehealth visit Gait & Station: Unable to assess due to telehealth visit Patient leans: N/A  Psychiatric Specialty Exam: Review of Systems  unknown if currently breastfeeding.There is no height or weight on file to calculate BMI.  General Appearance: Unable to assess due to telehealth visit. Patient camera not on  Eye Contact:  Unable to assess due to telehealth visit. Patient camera not on  Speech:  Clear and Coherent and Normal Rate  Volume:  Normal  Mood:  Euthymic  Affect:  Congruent  Thought Process:  Coherent, Goal Directed and Linear  Orientation:  Full (Time, Place, and Person)  Thought Content: WDL and Logical   Suicidal Thoughts:  No  Homicidal Thoughts:  No  Memory:  Immediate;   Good Recent;   Good Remote;   Good  Judgement:  Good  Insight:  Good  Psychomotor Activity:  Normal  Concentration:  Concentration: Good and Attention Span: Good  Recall:  Good  Fund of Knowledge: Good  Language: Good  Akathisia:  No  Handed:  Right  AIMS (if indicated): Not done  Assets:  Communication Skills Desire for Improvement Financial Resources/Insurance Housing Social Support  ADL's:  Intact  Cognition: WNL  Sleep:  Good   Screenings: Occupational psychologist from 06/13/2019 in [facility]  CAGE-AID Score 0    GAD-7   Flowsheet Row Video Visit from 01/13/2020 in [facility] Counselor from 06/13/2019 in [facility]  Total GAD-7 Score 2  2    Mini-Mental   Flowsheet Row Office Visit from 04/21/2018 in [facility]  Total Score (max 30 points ) 28    PHQ2-9   Flowsheet Row Video Visit from 01/13/2020 in [facility] Counselor from 06/13/2019 in [facility]  PHQ-2 Total Score 2 2  PHQ-9 Total Score 3 --       Assessment and Plan: Patient notes that she feels better since her last visit. Patient notes that she take her medications every other day because she has problems getting to the pharmacy. Provider recommended that patient take medications everyday. She requested that provider give her a 3 month supply of the medication and notes that she would take them daily. Provider agreed. No medication changes made. Patient agreeable to take medications as prescribed. 1. Bipolar I disorder with depression ([facility])  Continue- cariprazine (VRAYLAR) capsule; Take 1 capsule (3 mg total) by mouth daily.  Dispense: 30 capsule; Refill: 2 continue-  lithium carbonate (ESKALITH) 450 MG CR tablet; Take 1 tablet (450 mg total) by mouth at bedtime.  Dispense: 30 tablet; Refill: 2   Follow-up in 3 months Follow-up with therapy Shanna Cisco, NP 01/13/2020, 8:52 AM

## 2020-01-19 ENCOUNTER — Ambulatory Visit (HOSPITAL_COMMUNITY): Payer: No Payment, Other | Admitting: Licensed Clinical Social Worker

## 2020-01-19 ENCOUNTER — Telehealth (HOSPITAL_COMMUNITY): Payer: Self-pay | Admitting: Licensed Clinical Social Worker

## 2020-01-19 ENCOUNTER — Other Ambulatory Visit: Payer: Self-pay

## 2020-01-19 NOTE — Telephone Encounter (Signed)
LCSW sent two links to pt cell phone listed in epic with no response. After 10 minute in the video chat, LCSW f/u with PC and left HIPAA compliant VM.

## 2020-02-03 DIAGNOSIS — Z309 Encounter for contraceptive management, unspecified: Secondary | ICD-10-CM | POA: Diagnosis not present

## 2020-02-08 MED FILL — VRAYLAR 3 MG CAPSULE: 3 | 30 days supply | Qty: 30 | Fill #0

## 2020-03-09 DIAGNOSIS — F319 Bipolar disorder, unspecified: Secondary | ICD-10-CM | POA: Diagnosis not present

## 2020-03-09 DIAGNOSIS — Z0000 Encounter for general adult medical examination without abnormal findings: Secondary | ICD-10-CM | POA: Diagnosis not present

## 2020-03-09 DIAGNOSIS — Z13220 Encounter for screening for lipoid disorders: Secondary | ICD-10-CM | POA: Diagnosis not present

## 2020-03-09 DIAGNOSIS — Z124 Encounter for screening for malignant neoplasm of cervix: Secondary | ICD-10-CM | POA: Diagnosis not present

## 2020-03-14 DIAGNOSIS — Z30430 Encounter for insertion of intrauterine contraceptive device: Secondary | ICD-10-CM | POA: Diagnosis not present

## 2020-03-20 IMAGING — DX DG HIP (WITH OR WITHOUT PELVIS) 2-3V*R*
3 series · 3 of 3 positions shown · non-contrast
Comparison: None.

CLINICAL DATA: Pain over right inferior pubic ramus

EXAM:
DG HIP (WITH OR WITHOUT PELVIS) 2-3V RIGHT

[pelvis ap]
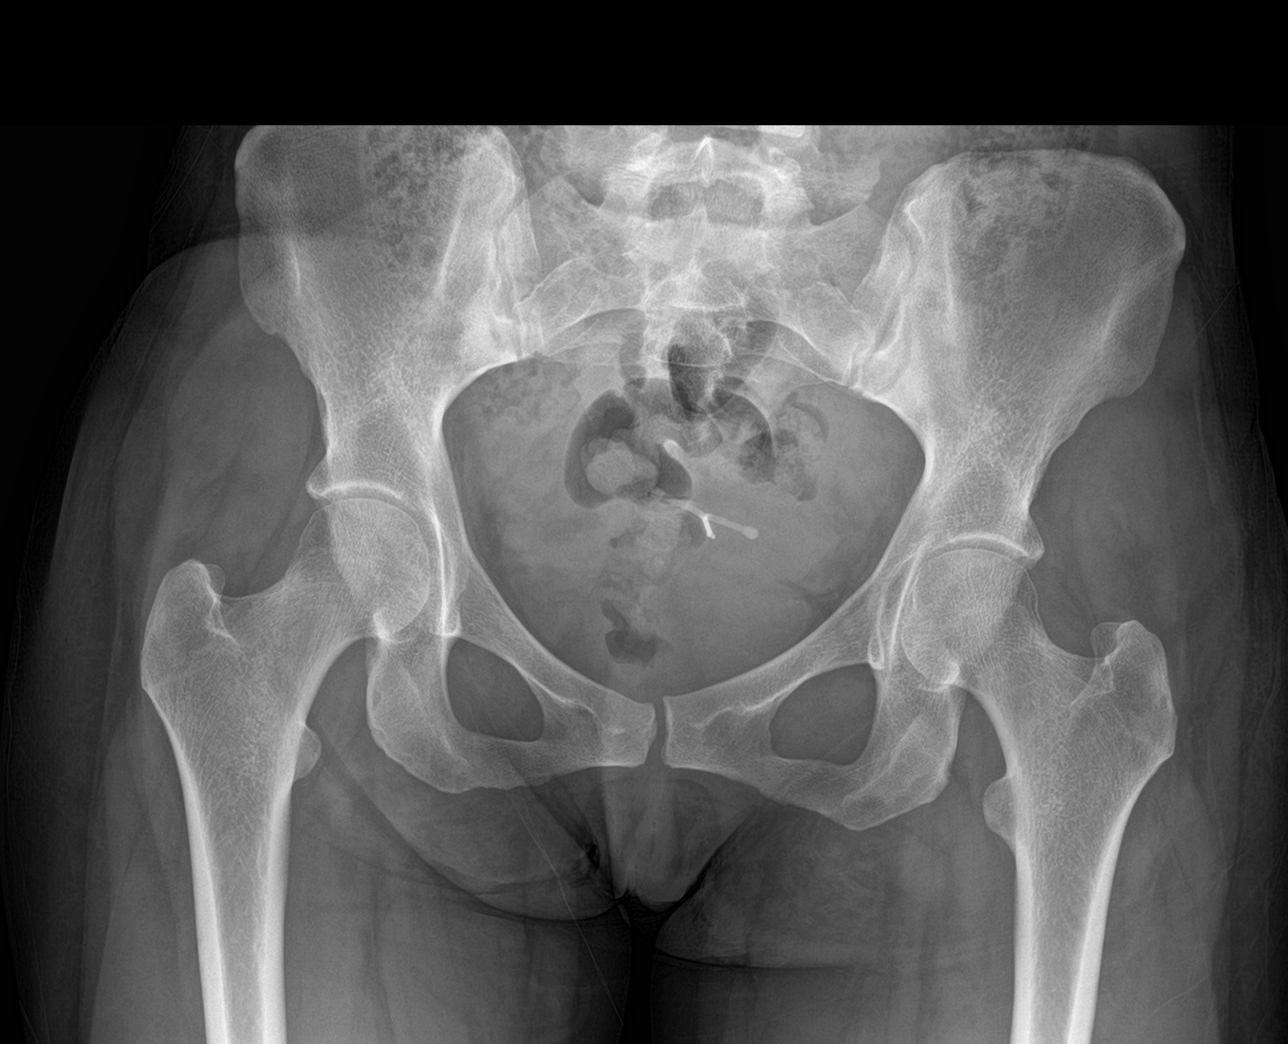

[hip ap]
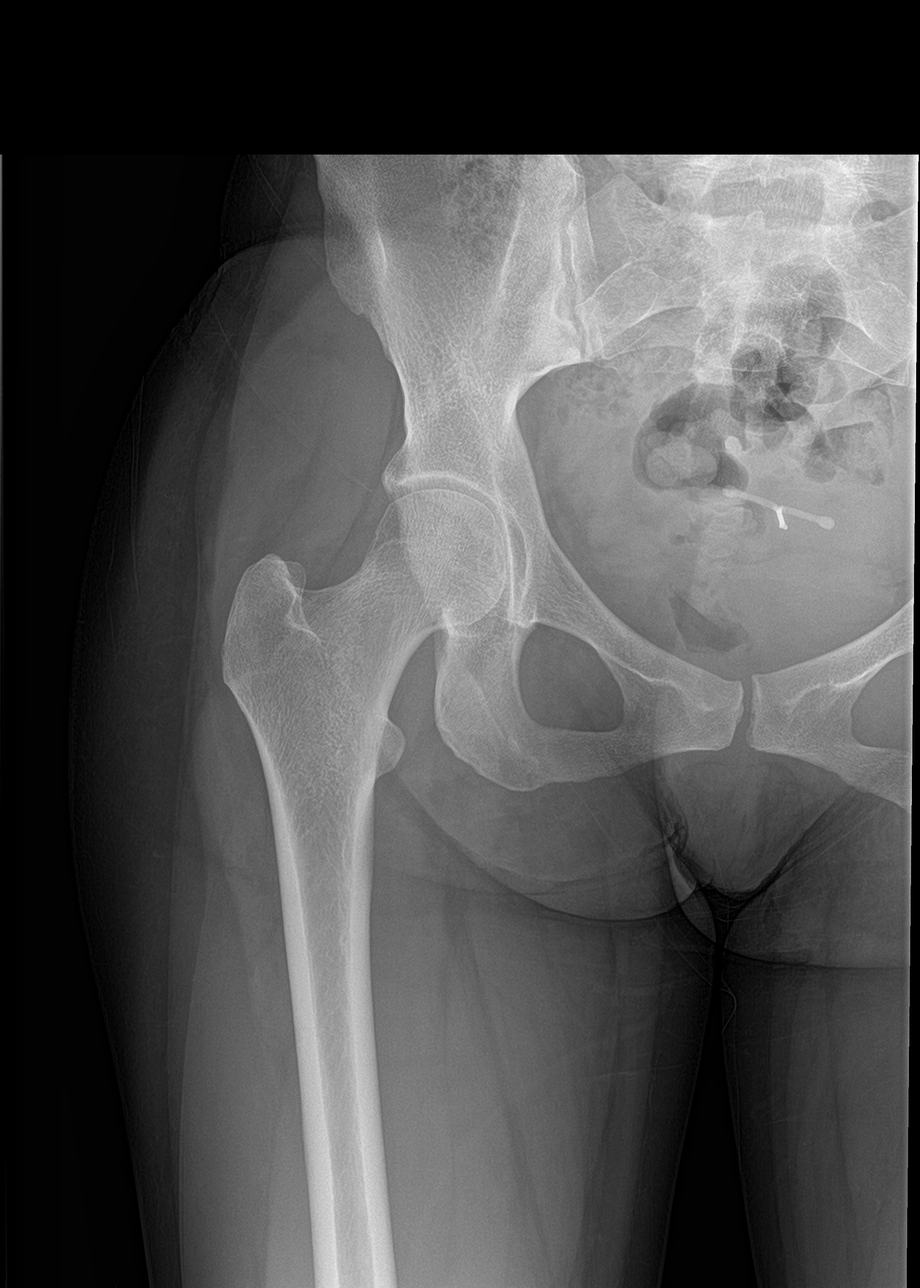

[hip lat]
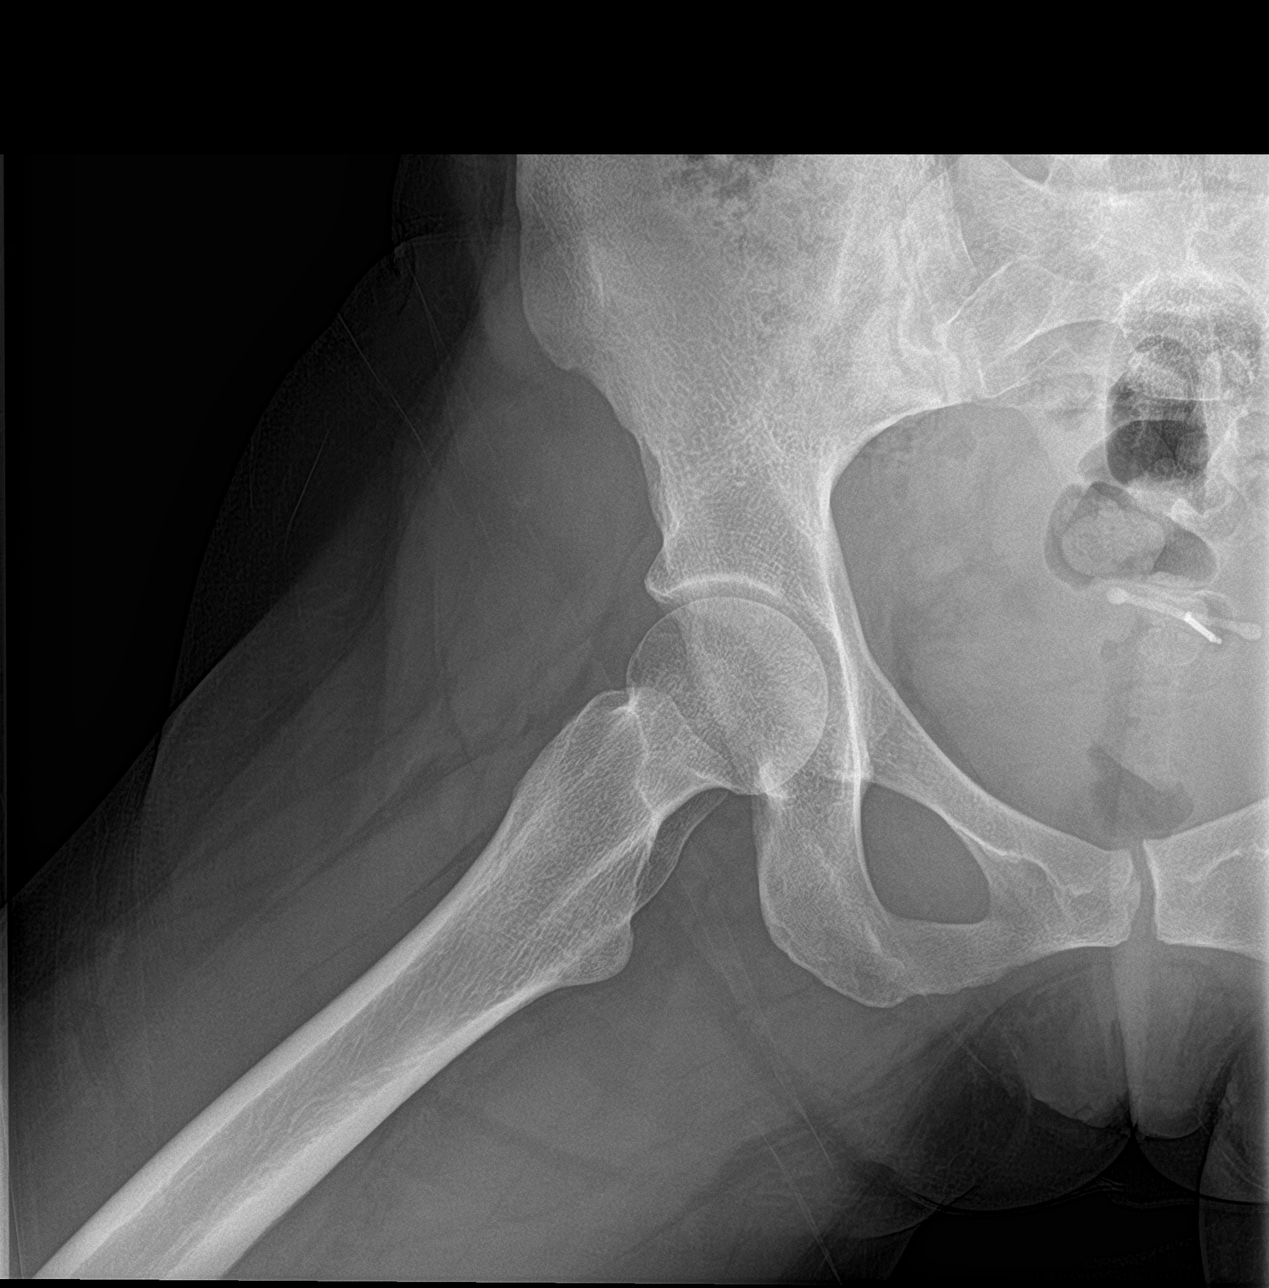

[3 of 3 positions shown; findings below may reference images not displayed]

FINDINGS: There is no evidence of hip fracture or dislocation. There is no
evidence of arthropathy or other focal bone abnormality. IUD in
satisfactory position
IMPRESSION: Negative.

## 2020-03-22 DIAGNOSIS — F3174 Bipolar disorder, in full remission, most recent episode manic: Secondary | ICD-10-CM | POA: Diagnosis not present

## 2020-03-26 DIAGNOSIS — Z79891 Long term (current) use of opiate analgesic: Secondary | ICD-10-CM | POA: Diagnosis not present

## 2020-04-07 ENCOUNTER — Other Ambulatory Visit: Payer: Self-pay

## 2020-04-12 ENCOUNTER — Telehealth (HOSPITAL_COMMUNITY): Payer: No Payment, Other | Admitting: Psychiatry

## 2020-04-12 DIAGNOSIS — Z79899 Other long term (current) drug therapy: Secondary | ICD-10-CM | POA: Diagnosis not present

## 2020-04-12 DIAGNOSIS — Z975 Presence of (intrauterine) contraceptive device: Secondary | ICD-10-CM | POA: Diagnosis not present

## 2020-04-12 DIAGNOSIS — N926 Irregular menstruation, unspecified: Secondary | ICD-10-CM | POA: Diagnosis not present

## 2020-04-12 DIAGNOSIS — Z30431 Encounter for routine checking of intrauterine contraceptive device: Secondary | ICD-10-CM | POA: Diagnosis not present

## 2020-04-19 DIAGNOSIS — F3174 Bipolar disorder, in full remission, most recent episode manic: Secondary | ICD-10-CM | POA: Diagnosis not present

## 2020-05-02 DIAGNOSIS — F3174 Bipolar disorder, in full remission, most recent episode manic: Secondary | ICD-10-CM | POA: Diagnosis not present

## 2020-05-15 DIAGNOSIS — F3174 Bipolar disorder, in full remission, most recent episode manic: Secondary | ICD-10-CM | POA: Diagnosis not present

## 2020-05-22 DIAGNOSIS — Z975 Presence of (intrauterine) contraceptive device: Secondary | ICD-10-CM | POA: Diagnosis not present

## 2020-05-22 DIAGNOSIS — N926 Irregular menstruation, unspecified: Secondary | ICD-10-CM | POA: Diagnosis not present

## 2020-05-22 DIAGNOSIS — Z30431 Encounter for routine checking of intrauterine contraceptive device: Secondary | ICD-10-CM | POA: Diagnosis not present

## 2020-05-22 DIAGNOSIS — N83202 Unspecified ovarian cyst, left side: Secondary | ICD-10-CM | POA: Diagnosis not present

## 2020-05-22 DIAGNOSIS — N83201 Unspecified ovarian cyst, right side: Secondary | ICD-10-CM | POA: Diagnosis not present

## 2020-05-31 DIAGNOSIS — F3174 Bipolar disorder, in full remission, most recent episode manic: Secondary | ICD-10-CM | POA: Diagnosis not present

## 2020-07-02 DIAGNOSIS — F3174 Bipolar disorder, in full remission, most recent episode manic: Secondary | ICD-10-CM | POA: Diagnosis not present

## 2020-07-03 DIAGNOSIS — N926 Irregular menstruation, unspecified: Secondary | ICD-10-CM | POA: Diagnosis not present

## 2020-07-03 DIAGNOSIS — Z975 Presence of (intrauterine) contraceptive device: Secondary | ICD-10-CM | POA: Diagnosis not present

## 2020-07-03 DIAGNOSIS — N83202 Unspecified ovarian cyst, left side: Secondary | ICD-10-CM | POA: Diagnosis not present

## 2020-07-03 DIAGNOSIS — N83201 Unspecified ovarian cyst, right side: Secondary | ICD-10-CM | POA: Diagnosis not present

## 2020-07-19 DIAGNOSIS — F3174 Bipolar disorder, in full remission, most recent episode manic: Secondary | ICD-10-CM | POA: Diagnosis not present

## 2020-08-27 DIAGNOSIS — L738 Other specified follicular disorders: Secondary | ICD-10-CM | POA: Diagnosis not present

## 2020-08-27 DIAGNOSIS — L818 Other specified disorders of pigmentation: Secondary | ICD-10-CM | POA: Diagnosis not present

## 2020-08-27 DIAGNOSIS — B0089 Other herpesviral infection: Secondary | ICD-10-CM | POA: Diagnosis not present

## 2020-09-19 DIAGNOSIS — F3174 Bipolar disorder, in full remission, most recent episode manic: Secondary | ICD-10-CM | POA: Diagnosis not present

## 2020-09-27 DIAGNOSIS — F3174 Bipolar disorder, in full remission, most recent episode manic: Secondary | ICD-10-CM | POA: Diagnosis not present

## 2020-12-13 DIAGNOSIS — F3174 Bipolar disorder, in full remission, most recent episode manic: Secondary | ICD-10-CM | POA: Diagnosis not present

## 2021-03-13 DIAGNOSIS — Z13220 Encounter for screening for lipoid disorders: Secondary | ICD-10-CM | POA: Diagnosis not present

## 2021-03-13 DIAGNOSIS — Z0000 Encounter for general adult medical examination without abnormal findings: Secondary | ICD-10-CM | POA: Diagnosis not present

## 2021-03-13 DIAGNOSIS — F319 Bipolar disorder, unspecified: Secondary | ICD-10-CM | POA: Diagnosis not present

## 2021-05-20 DIAGNOSIS — F3174 Bipolar disorder, in full remission, most recent episode manic: Secondary | ICD-10-CM | POA: Diagnosis not present

## 2021-06-26 DIAGNOSIS — F3174 Bipolar disorder, in full remission, most recent episode manic: Secondary | ICD-10-CM | POA: Diagnosis not present

## 2021-12-23 DIAGNOSIS — F3174 Bipolar disorder, in full remission, most recent episode manic: Secondary | ICD-10-CM | POA: Diagnosis not present

## 2022-01-09 DIAGNOSIS — Z01419 Encounter for gynecological examination (general) (routine) without abnormal findings: Secondary | ICD-10-CM | POA: Diagnosis not present

## 2022-01-09 DIAGNOSIS — N83202 Unspecified ovarian cyst, left side: Secondary | ICD-10-CM | POA: Diagnosis not present

## 2022-01-09 DIAGNOSIS — N83201 Unspecified ovarian cyst, right side: Secondary | ICD-10-CM | POA: Diagnosis not present

## 2022-01-09 DIAGNOSIS — Z975 Presence of (intrauterine) contraceptive device: Secondary | ICD-10-CM | POA: Diagnosis not present

## 2022-02-05 IMAGING — CT CT ANGIO NECK
2 of 7 series · 8 of 33 positions shown · IV contrast (omnipaque)
Comparison: No pertinent prior studies available for comparison.

CLINICAL DATA: Suicide attempt by hanging; neck trauma, blunt.

EXAM:
CT ANGIOGRAPHY NECK
TECHNIQUE: Multidetector CT imaging of the neck was performed using the
standard protocol during bolus administration of intravenous
contrast. Multiplanar CT image reconstructions and MIPs were
obtained to evaluate the vascular anatomy. Carotid stenosis
measurements (when applicable) are obtained utilizing NASCET
criteria, using the distal internal carotid diameter as the
denominator.
CONTRAST:  100mL OMNIPAQUE IOHEXOL 350 MG/ML SOLN

[Series 4: cta neck · axial · 0.39mm/px · z∈[-298,-220]mm · 2 of 118 slices shown]
[im 40/118  soft-tissue]
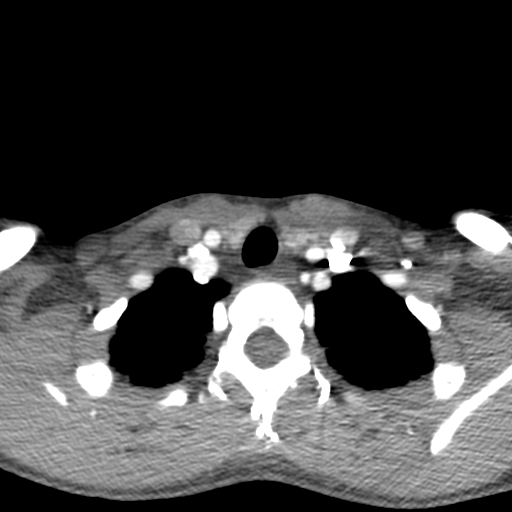
[im 79/118  soft-tissue]
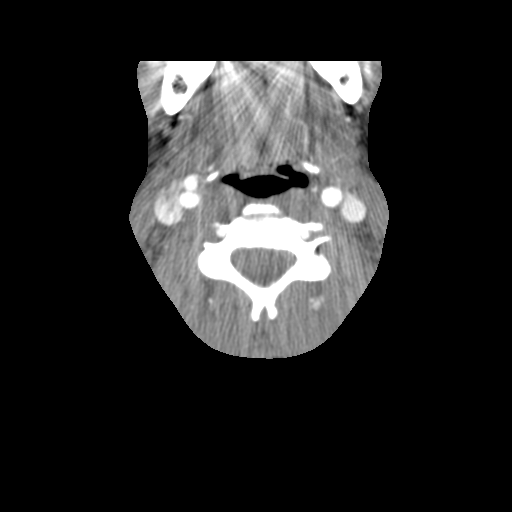

[Series 6: ax thin · axial · 0.27mm/px · z∈[-366,-185]mm · 6 of 264 slices shown]
[im 38/264  soft-tissue]
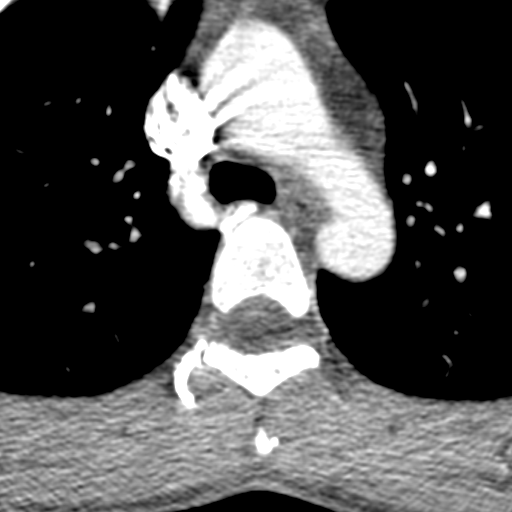
[im 76/264  bone]
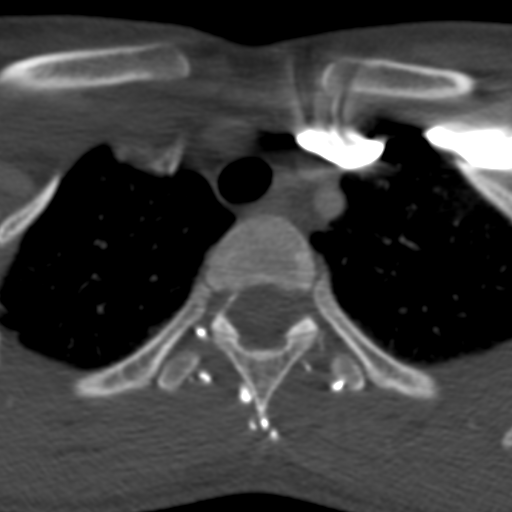
[im 113/264  soft-tissue]
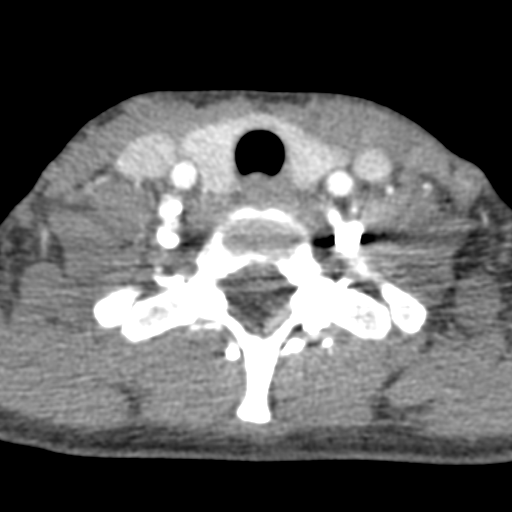
[im 151/264  bone]
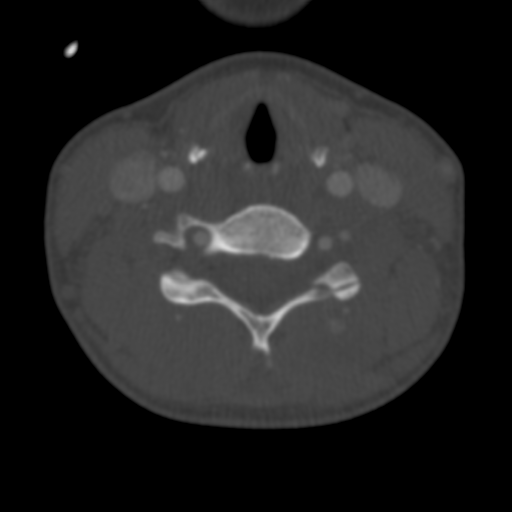
[im 188/264  soft-tissue]
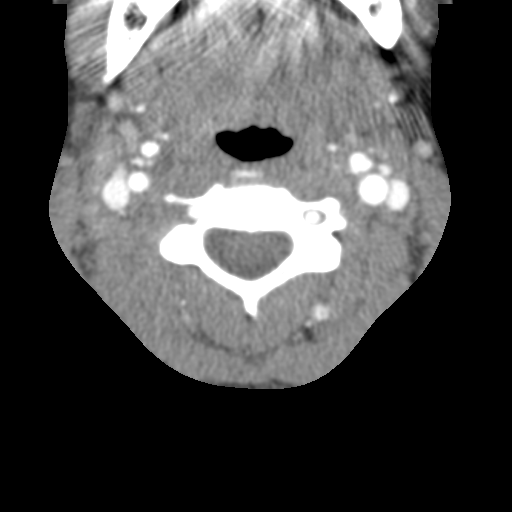
[im 226/264  bone]
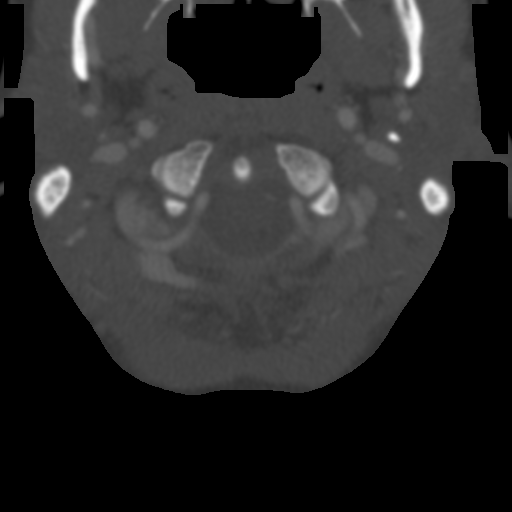

[8 of 33 positions shown; findings below may reference images not displayed]

FINDINGS: Aortic arch: Standard aortic branching. The visualized aortic arch
is unremarkable. Streak artifact from a dense left-sided contrast
bolus limits evaluation of the proximal major branch vessels of the
neck. Within this limitation, there is no appreciable significant
innominate or proximal subclavian artery stenosis.

Right carotid system: CCA and ICA smooth and patent within the neck
without stenosis.

Left carotid system: CCA and ICA smooth and patent within the neck
without stenosis.

Vertebral arteries: Codominant. Smooth and patent within the neck
without stenosis.

Skeleton: No acute bony abnormality. Reversal of the expected
cervical lordosis.

Other neck: No neck mass or cervical lymphadenopathy.

Upper chest: No consolidation within the imaged lung apices.
IMPRESSION: Streak artifact from a dense left-sided contrast bolus somewhat
limits evaluation of the origins of the great vessels of the neck.

Within this limitation, the bilateral common carotid, internal
carotid and vertebral arteries are patent within the neck without
stenosis. No evidence of blunt cerebrovascular injury.

## 2022-02-06 DIAGNOSIS — N83201 Unspecified ovarian cyst, right side: Secondary | ICD-10-CM | POA: Diagnosis not present

## 2022-02-06 DIAGNOSIS — Z975 Presence of (intrauterine) contraceptive device: Secondary | ICD-10-CM | POA: Diagnosis not present

## 2022-02-06 DIAGNOSIS — N83202 Unspecified ovarian cyst, left side: Secondary | ICD-10-CM | POA: Diagnosis not present

## 2022-03-18 DIAGNOSIS — Z79899 Other long term (current) drug therapy: Secondary | ICD-10-CM | POA: Diagnosis not present

## 2022-03-18 DIAGNOSIS — Z0000 Encounter for general adult medical examination without abnormal findings: Secondary | ICD-10-CM | POA: Diagnosis not present

## 2022-03-18 DIAGNOSIS — F319 Bipolar disorder, unspecified: Secondary | ICD-10-CM | POA: Diagnosis not present

## 2022-04-03 DIAGNOSIS — N838 Other noninflammatory disorders of ovary, fallopian tube and broad ligament: Secondary | ICD-10-CM | POA: Diagnosis not present

## 2022-07-23 DIAGNOSIS — Z975 Presence of (intrauterine) contraceptive device: Secondary | ICD-10-CM | POA: Diagnosis not present

## 2022-07-23 DIAGNOSIS — N80129 Deep endometriosis of ovary, unspecified ovary: Secondary | ICD-10-CM | POA: Diagnosis not present

## 2022-08-28 DIAGNOSIS — F3174 Bipolar disorder, in full remission, most recent episode manic: Secondary | ICD-10-CM | POA: Diagnosis not present

## 2022-09-03 DIAGNOSIS — F319 Bipolar disorder, unspecified: Secondary | ICD-10-CM | POA: Diagnosis not present

## 2022-10-03 DIAGNOSIS — B070 Plantar wart: Secondary | ICD-10-CM | POA: Diagnosis not present

## 2022-10-03 DIAGNOSIS — B0089 Other herpesviral infection: Secondary | ICD-10-CM | POA: Diagnosis not present

## 2022-10-03 DIAGNOSIS — B078 Other viral warts: Secondary | ICD-10-CM | POA: Diagnosis not present

## 2022-10-20 DIAGNOSIS — F319 Bipolar disorder, unspecified: Secondary | ICD-10-CM | POA: Diagnosis not present

## 2023-02-23 ENCOUNTER — Other Ambulatory Visit: Payer: Self-pay | Admitting: Family Medicine

## 2023-02-23 DIAGNOSIS — R1011 Right upper quadrant pain: Secondary | ICD-10-CM

## 2023-02-24 ENCOUNTER — Other Ambulatory Visit: Payer: Self-pay

## 2023-02-26 ENCOUNTER — Ambulatory Visit: Payer: Self-pay

## 2023-02-26 ENCOUNTER — Other Ambulatory Visit: Payer: Self-pay

## 2023-02-26 DIAGNOSIS — R1011 Right upper quadrant pain: Secondary | ICD-10-CM | POA: Diagnosis not present
